# Patient Record
Sex: Female | Born: 1965 | Race: Black or African American | Hispanic: No | Marital: Married | Smoking: Never smoker
Health system: Southern US, Community
[De-identification: ages and names within clinical notes are randomized; demographics above are authoritative.]

## PROBLEM LIST (undated history)

## (undated) DIAGNOSIS — D649 Anemia, unspecified: Secondary | ICD-10-CM

## (undated) DIAGNOSIS — D259 Leiomyoma of uterus, unspecified: Secondary | ICD-10-CM

## (undated) DIAGNOSIS — Z789 Other specified health status: Secondary | ICD-10-CM

## (undated) HISTORY — PX: TUBAL LIGATION: SHX77

## (undated) HISTORY — DX: Leiomyoma of uterus, unspecified: D25.9

## (undated) HISTORY — PX: ABDOMINAL HYSTERECTOMY: SHX81

---

## 2005-03-22 ENCOUNTER — Emergency Department: Payer: Self-pay | Admitting: Emergency Medicine

## 2005-04-16 ENCOUNTER — Ambulatory Visit: Payer: Self-pay | Admitting: Unknown Physician Specialty

## 2006-04-25 ENCOUNTER — Ambulatory Visit: Payer: Self-pay | Admitting: Unknown Physician Specialty

## 2006-11-21 ENCOUNTER — Emergency Department: Payer: Self-pay

## 2007-05-22 ENCOUNTER — Ambulatory Visit: Payer: Self-pay | Admitting: Unknown Physician Specialty

## 2008-06-03 ENCOUNTER — Ambulatory Visit: Payer: Self-pay | Admitting: Unknown Physician Specialty

## 2009-06-06 ENCOUNTER — Ambulatory Visit: Payer: Self-pay | Admitting: Unknown Physician Specialty

## 2009-09-10 HISTORY — PX: UTERINE FIBROID EMBOLIZATION: SHX825

## 2010-06-07 ENCOUNTER — Ambulatory Visit: Payer: Self-pay | Admitting: Unknown Physician Specialty

## 2011-03-11 ENCOUNTER — Emergency Department: Payer: Self-pay | Admitting: Emergency Medicine

## 2011-03-14 ENCOUNTER — Emergency Department: Payer: Self-pay | Admitting: Emergency Medicine

## 2011-06-11 ENCOUNTER — Ambulatory Visit: Payer: Self-pay | Admitting: Unknown Physician Specialty

## 2012-06-13 ENCOUNTER — Ambulatory Visit: Payer: Self-pay | Admitting: Obstetrics and Gynecology

## 2013-06-15 ENCOUNTER — Ambulatory Visit: Payer: Self-pay | Admitting: Obstetrics & Gynecology

## 2014-04-22 ENCOUNTER — Ambulatory Visit: Payer: Self-pay | Admitting: Unknown Physician Specialty

## 2014-04-27 LAB — PATHOLOGY REPORT

## 2014-06-18 ENCOUNTER — Ambulatory Visit: Payer: Self-pay | Admitting: Obstetrics and Gynecology

## 2014-12-24 ENCOUNTER — Other Ambulatory Visit: Payer: Self-pay | Admitting: Obstetrics and Gynecology

## 2014-12-24 DIAGNOSIS — Z1231 Encounter for screening mammogram for malignant neoplasm of breast: Secondary | ICD-10-CM

## 2015-06-17 ENCOUNTER — Ambulatory Visit
Admission: RE | Admit: 2015-06-17 | Discharge: 2015-06-17 | Disposition: A | Discharge status: Home / Self Care | Payer: Federal, State, Local not specified - PPO | Source: Ambulatory Visit | Attending: Obstetrics and Gynecology | Admitting: Obstetrics and Gynecology

## 2015-06-17 DIAGNOSIS — Z1231 Encounter for screening mammogram for malignant neoplasm of breast: Secondary | ICD-10-CM | POA: Insufficient documentation

## 2015-12-14 ENCOUNTER — Encounter: Payer: Self-pay | Admitting: Obstetrics and Gynecology

## 2015-12-14 ENCOUNTER — Ambulatory Visit (INDEPENDENT_AMBULATORY_CARE_PROVIDER_SITE_OTHER): Payer: Federal, State, Local not specified - PPO | Admitting: Obstetrics and Gynecology

## 2015-12-14 VITALS — BP 107/62 | HR 63 | Ht 64.0 in | Wt 155.8 lb

## 2015-12-14 DIAGNOSIS — Z1239 Encounter for other screening for malignant neoplasm of breast: Secondary | ICD-10-CM | POA: Diagnosis not present

## 2015-12-14 DIAGNOSIS — N940 Mittelschmerz: Secondary | ICD-10-CM

## 2015-12-14 DIAGNOSIS — Z01419 Encounter for gynecological examination (general) (routine) without abnormal findings: Secondary | ICD-10-CM

## 2015-12-14 DIAGNOSIS — E663 Overweight: Secondary | ICD-10-CM | POA: Diagnosis not present

## 2015-12-14 NOTE — Patient Instructions (Signed)
Health Maintenance, Female Adopting a healthy lifestyle and getting preventive care can go a long way to promote health and wellness. Talk with your health care provider about what schedule of regular examinations is right for you. This is a good chance for you to check in with your provider about disease prevention and staying healthy. In between checkups, there are plenty of things you can do on your own. Experts have done a lot of research about which lifestyle changes and preventive measures are most likely to keep you healthy. Ask your health care provider for more information. WEIGHT AND DIET  Eat a healthy diet  Be sure to include plenty of vegetables, fruits, low-fat dairy products, and lean protein.  Do not eat a lot of foods high in solid fats, added sugars, or salt.  Get regular exercise. This is one of the most important things you can do for your health.  Most adults should exercise for at least 150 minutes each week. The exercise should increase your heart rate and make you sweat (moderate-intensity exercise).  Most adults should also do strengthening exercises at least twice a week. This is in addition to the moderate-intensity exercise.  Maintain a healthy weight  Body mass index (BMI) is a measurement that can be used to identify possible weight problems. It estimates body fat based on height and weight. Your health care provider can help determine your BMI and help you achieve or maintain a healthy weight.  For females 45 years of age and older:   A BMI below 18.5 is considered underweight.  A BMI of 18.5 to 24.9 is normal.  A BMI of 25 to 29.9 is considered overweight.  A BMI of 30 and above is considered obese.  Watch levels of cholesterol and blood lipids  You should start having your blood tested for lipids and cholesterol at 52 years of age, then have this test every 5 years.  You may need to have your cholesterol levels checked more often if:  Your lipid  or cholesterol levels are high.  You are older than 52 years of age.  You are at high risk for heart disease.  CANCER SCREENING   Lung Cancer  Lung cancer screening is recommended for adults 30-67 years old who are at high risk for lung cancer because of a history of smoking.  A yearly low-dose CT scan of the lungs is recommended for people who:  Currently smoke.  Have quit within the past 15 years.  Have at least a 30-pack-year history of smoking. A pack year is smoking an average of one pack of cigarettes a day for 1 year.  Yearly screening should continue until it has been 15 years since you quit.  Yearly screening should stop if you develop a health problem that would prevent you from having lung cancer treatment.  Breast Cancer  Practice breast self-awareness. This means understanding how your breasts normally appear and feel.  It also means doing regular breast self-exams. Let your health care provider know about any changes, no matter how small.  If you are in your 20s or 30s, you should have a clinical breast exam (CBE) by a health care provider every 1-3 years as part of a regular health exam.  If you are 75 or older, have a CBE every year. Also consider having a breast X-ray (mammogram) every year.  If you have a family history of breast cancer, talk to your health care provider about genetic screening.  If you  are at high risk for breast cancer, talk to your health care provider about having an MRI and a mammogram every year.  Breast cancer gene (BRCA) assessment is recommended for women who have family members with BRCA-related cancers. BRCA-related cancers include:  Breast.  Ovarian.  Tubal.  Peritoneal cancers.  Results of the assessment will determine the need for genetic counseling and BRCA1 and BRCA2 testing. Cervical Cancer Your health care provider may recommend that you be screened regularly for cancer of the pelvic organs (ovaries, uterus, and  vagina). This screening involves a pelvic examination, including checking for microscopic changes to the surface of your cervix (Pap test). You may be encouraged to have this screening done every 3 years, beginning at age 21.  For women ages 30-65, health care providers may recommend pelvic exams and Pap testing every 3 years, or they may recommend the Pap and pelvic exam, combined with testing for human papilloma virus (HPV), every 5 years. Some types of HPV increase your risk of cervical cancer. Testing for HPV may also be done on women of any age with unclear Pap test results.  Other health care providers may not recommend any screening for nonpregnant women who are considered low risk for pelvic cancer and who do not have symptoms. Ask your health care provider if a screening pelvic exam is right for you.  If you have had past treatment for cervical cancer or a condition that could lead to cancer, you need Pap tests and screening for cancer for at least 20 years after your treatment. If Pap tests have been discontinued, your risk factors (such as having a new sexual partner) need to be reassessed to determine if screening should resume. Some women have medical problems that increase the chance of getting cervical cancer. In these cases, your health care provider may recommend more frequent screening and Pap tests. Colorectal Cancer  This type of cancer can be detected and often prevented.  Routine colorectal cancer screening usually begins at 52 years of age and continues through 52 years of age.  Your health care provider may recommend screening at an earlier age if you have risk factors for colon cancer.  Your health care provider may also recommend using home test kits to check for hidden blood in the stool.  A small camera at the end of a tube can be used to examine your colon directly (sigmoidoscopy or colonoscopy). This is done to check for the earliest forms of colorectal  cancer.  Routine screening usually begins at age 50.  Direct examination of the colon should be repeated every 5-10 years through 52 years of age. However, you may need to be screened more often if early forms of precancerous polyps or small growths are found. Skin Cancer  Check your skin from head to toe regularly.  Tell your health care provider about any new moles or changes in moles, especially if there is a change in a mole's shape or color.  Also tell your health care provider if you have a mole that is larger than the size of a pencil eraser.  Always use sunscreen. Apply sunscreen liberally and repeatedly throughout the day.  Protect yourself by wearing long sleeves, pants, a wide-brimmed hat, and sunglasses whenever you are outside. HEART DISEASE, DIABETES, AND HIGH BLOOD PRESSURE   High blood pressure causes heart disease and increases the risk of stroke. High blood pressure is more likely to develop in:  People who have blood pressure in the high end   of the normal range (130-139/85-89 mm Hg).  People who are overweight or obese.  People who are African American.  If you are 38-23 years of age, have your blood pressure checked every 3-5 years. If you are 61 years of age or older, have your blood pressure checked every year. You should have your blood pressure measured twice--once when you are at a hospital or clinic, and once when you are not at a hospital or clinic. Record the average of the two measurements. To check your blood pressure when you are not at a hospital or clinic, you can use:  An automated blood pressure machine at a pharmacy.  A home blood pressure monitor.  If you are between 45 years and 39 years old, ask your health care provider if you should take aspirin to prevent strokes.  Have regular diabetes screenings. This involves taking a blood sample to check your fasting blood sugar level.  If you are at a normal weight and have a low risk for diabetes,  have this test once every three years after 52 years of age.  If you are overweight and have a high risk for diabetes, consider being tested at a younger age or more often. PREVENTING INFECTION  Hepatitis B  If you have a higher risk for hepatitis B, you should be screened for this virus. You are considered at high risk for hepatitis B if:  You were born in a country where hepatitis B is common. Ask your health care provider which countries are considered high risk.  Your parents were born in a high-risk country, and you have not been immunized against hepatitis B (hepatitis B vaccine).  You have HIV or AIDS.  You use needles to inject street drugs.  You live with someone who has hepatitis B.  You have had sex with someone who has hepatitis B.  You get hemodialysis treatment.  You take certain medicines for conditions, including cancer, organ transplantation, and autoimmune conditions. Hepatitis C  Blood testing is recommended for:  Everyone born from [birth year] through 1967.  Anyone with known risk factors for hepatitis C. Sexually transmitted infections (STIs)  You should be screened for sexually transmitted infections (STIs) including gonorrhea and chlamydia if:  You are sexually active and are younger than 52 years of age.  You are older than 53 years of age and your health care provider tells you that you are at risk for this type of infection.  Your sexual activity has changed since you were last screened and you are at an increased risk for chlamydia or gonorrhea. Ask your health care provider if you are at risk.  If you do not have HIV, but are at risk, it may be recommended that you take a prescription medicine daily to prevent HIV infection. This is called pre-exposure prophylaxis (PrEP). You are considered at risk if:  You are sexually active and do not regularly use condoms or know the HIV status of your partner(s).  You take drugs by injection.  You are sexually  active with a partner who has HIV. Talk with your health care provider about whether you are at high risk of being infected with HIV. If you choose to begin PrEP, you should first be tested for HIV. You should then be tested every 3 months for as long as you are taking PrEP.  PREGNANCY   If you are premenopausal and you may become pregnant, ask your health care provider about preconception counseling.  If you may  become pregnant, take 400 to 800 micrograms (mcg) of folic acid every day.  If you want to prevent pregnancy, talk to your health care provider about birth control (contraception). OSTEOPOROSIS AND MENOPAUSE   Osteoporosis is a disease in which the bones lose minerals and strength with aging. This can result in serious bone fractures. Your risk for osteoporosis can be identified using a bone density scan.  If you are 18 years of age or older, or if you are at risk for osteoporosis and fractures, ask your health care provider if you should be screened.  Ask your health care provider whether you should take a calcium or vitamin D supplement to lower your risk for osteoporosis.  Menopause may have certain physical symptoms and risks.  Hormone replacement therapy may reduce some of these symptoms and risks. Talk to your health care provider about whether hormone replacement therapy is right for you.  HOME CARE INSTRUCTIONS   Schedule regular health, dental, and eye exams.  Stay current with your immunizations.   Do not use any tobacco products including cigarettes, chewing tobacco, or electronic cigarettes.  If you are pregnant, do not drink alcohol.  If you are breastfeeding, limit how much and how often you drink alcohol.  Limit alcohol intake to no more than 1 drink per day for nonpregnant women. One drink equals 12 ounces of beer, 5 ounces of wine, or 1 ounces of hard liquor.  Do not use street drugs.  Do not share needles.  Ask your health care provider for help if  you need support or information about quitting drugs.  Tell your health care provider if you often feel depressed.  Tell your health care provider if you have ever been abused or do not feel safe at home.   This information is not intended to replace advice given to you by your health care provider. Make sure you discuss any questions you have with your health care provider.   Document Released: 03/12/2011 Document Revised: 09/17/2014 Document Reviewed: 07/29/2013 Elsevier Interactive Patient Education [employer].

## 2015-12-14 NOTE — Progress Notes (Signed)
GYNECOLOGY ANNUAL PHYSICAL EXAM PROGRESS NOTE  Subjective:    Lindsay Ward is a 50 y.o. P89 female who presents for an annual exam. The patient has no complaints today. The patient is sexually active. The patient wears seatbelts: yes. The patient participates in regular exercise: no. Has the patient ever been transfused or tattooed?: no. The patient reports that there is not domestic violence in her life.    Gynecologic History Menarche age: 35  Patient's last menstrual period was 12/07/2015. Contraception: tubal ligation History of STI's: Denies Last Pap: 12/2014. Results were: normal.  Denies h/o abnormal pap smears. Last mammogram: 06/2015. Results were: normal Last colonoscopy: 04/2015.  Results were: normal   Obstetric History   G2   P1   T1   P0   A1   TAB1   SAB0   E0   M0   L1     # Outcome Date GA Lbr Len/2nd Weight Sex Delivery Anes PTL Lv  2 TAB           1 Term      CS-LTranv  N Y       Past Medical History  Diagnosis Date  . Fibroid uterus     Past Surgical History  Procedure Laterality Date  . Tubal ligation    . Cesarean section    . Prolapsed uterine fibroid ligation      Family History  Problem Relation Age of Onset  . Breast cancer Neg Hx   . Hypertension Mother   . Diabetes Mother   . Diabetes Father     Social History   Social History  . Marital Status: Married    Spouse Name: N/A  . Number of Children: N/A  . Years of Education: N/A   Occupational History  . Not on file.   Social History Main Topics  . Smoking status: Never Smoker   . Smokeless tobacco: Not on file  . Alcohol Use: No  . Drug Use: No  . Sexual Activity: Yes    Birth Control/ Protection: Surgical   Other Topics Concern  . Not on file   Social History Narrative    No current outpatient prescriptions on file prior to visit.   No current facility-administered medications on file prior to visit.    No Known Allergies   Review of  Systems Constitutional: negative for chills, fatigue, fevers and sweats Eyes: negative for irritation, redness and visual disturbance Ears, nose, mouth, throat, and face: negative for hearing loss, nasal congestion, snoring and tinnitus Respiratory: negative for asthma, cough, sputum Cardiovascular: negative for chest pain, dyspnea, exertional chest pressure/discomfort, irregular heart beat, palpitations and syncope Gastrointestinal: negative for abdominal pain, change in bowel habits, nausea and vomiting Genitourinary: positive for occasional pelvic discomfort, alternating sides, around time of ovulation; also notes cycles stretching (coming q 4-5 weeks). Negative for abnormal menstrual periods, genital lesions, sexual problems and vaginal discharge, dysuria and urinary incontinence Integument/breast: negative for breast lump, breast tenderness and nipple discharge Hematologic/lymphatic: negative for bleeding and easy bruising Musculoskeletal:negative for back pain and muscle weakness Neurological: negative for dizziness, headaches, vertigo and weakness Endocrine: negative for diabetic symptoms including polydipsia, polyuria and skin dryness Allergic/Immunologic: negative for hay fever and urticaria       Objective:  Blood pressure 107/62, pulse 63, height 5\' 4"  (1.626 m), weight 155 lb 12.8 oz (70.67 kg), last menstrual period 12/07/2015. Body mass index is 26.73 kg/(m^2).  General Appearance:    Alert, cooperative, no distress,  appears stated age, overweight  Head:    Normocephalic, without obvious abnormality, atraumatic  Eyes:    PERRL, conjunctiva/corneas clear, EOM's intact, both eyes  Ears:    Normal external ear canals, both ears  Nose:   Nares normal, septum midline, mucosa normal, no drainage or sinus tenderness  Throat:   Lips, mucosa, and tongue normal; teeth and gums normal  Neck:   Supple, symmetrical, trachea midline, no adenopathy; thyroid: no  enlargement/tenderness/nodules; no carotid bruit or JVD  Back:     Symmetric, no curvature, ROM normal, no CVA tenderness  Lungs:     Clear to auscultation bilaterally, respirations unlabored  Chest Wall:    No tenderness or deformity   Heart:    Regular rate and rhythm, S1 and S2 normal, no murmur, rub or gallop  Breast Exam:    No tenderness, masses, or nipple abnormality  Abdomen:     Soft, non-tender, bowel sounds active all four quadrants, no masses, no organomegaly.    Genitalia:    Pelvic:external genitalia normal, vagina without lesions, discharge, or tenderness, rectovaginal septum  normal. Cervix normal in appearance, no cervical motion tenderness, no adnexal masses or tenderness.  Uterus enlarged (~ 14 week size) slightly deviated to the right, normal shape, mobile, regular contours, nontender.  Rectal:    Normal external sphincter.  No hemorrhoids appreciated. Internal exam not done.   Extremities:   Extremities normal, atraumatic, no cyanosis or edema  Pulses:   2+ and symmetric all extremities  Skin:   Skin color, texture, turgor normal, no rashes or lesions  Lymph nodes:   Cervical, supraclavicular, and axillary nodes normal  Neurologic:   CNII-XII intact, normal strength, sensation and reflexes throughout     Assessment:   Healthy female exam.   Mittelschmerz pain Overweight    Plan:    Patient has annual labs done by PCP.  Pap smear up to date.  Will be due for mammogram in October, ordered.  Breast self exam technique reviewed and patient encouraged to perform self-exam monthly. Discussed healthy lifestyle modifications. Can take OTC pain relievers as needed for ovulatory pain.   Follow up in 1 year for annual exam, or as needed.    Rubie Maid, MD [facility]

## 2015-12-16 ENCOUNTER — Encounter: Payer: Self-pay | Admitting: Obstetrics and Gynecology

## 2015-12-20 ENCOUNTER — Encounter: Payer: Self-pay | Admitting: Obstetrics and Gynecology

## 2016-06-18 ENCOUNTER — Ambulatory Visit
Admission: RE | Admit: 2016-06-18 | Discharge: 2016-06-18 | Disposition: A | Discharge status: Home / Self Care | Payer: Federal, State, Local not specified - PPO | Source: Ambulatory Visit | Attending: Obstetrics and Gynecology | Admitting: Obstetrics and Gynecology

## 2016-06-18 DIAGNOSIS — Z1231 Encounter for screening mammogram for malignant neoplasm of breast: Secondary | ICD-10-CM | POA: Insufficient documentation

## 2016-06-18 DIAGNOSIS — Z1239 Encounter for other screening for malignant neoplasm of breast: Secondary | ICD-10-CM

## 2016-09-12 ENCOUNTER — Encounter: Payer: Self-pay | Admitting: Obstetrics and Gynecology

## 2016-09-12 ENCOUNTER — Ambulatory Visit (INDEPENDENT_AMBULATORY_CARE_PROVIDER_SITE_OTHER): Payer: Federal, State, Local not specified - PPO | Admitting: Obstetrics and Gynecology

## 2016-09-12 VITALS — BP 130/85 | HR 80 | Ht 64.0 in | Wt 165.5 lb

## 2016-09-12 DIAGNOSIS — N924 Excessive bleeding in the premenopausal period: Secondary | ICD-10-CM | POA: Diagnosis not present

## 2016-09-12 DIAGNOSIS — D259 Leiomyoma of uterus, unspecified: Secondary | ICD-10-CM | POA: Diagnosis not present

## 2016-09-12 NOTE — Patient Instructions (Addendum)
Dysfunctional Uterine Bleeding Introduction Dysfunctional uterine bleeding is abnormal bleeding from the uterus. Dysfunctional uterine bleeding includes:  A period that comes earlier or later than usual.  A period that is lighter, heavier, or has blood clots.  Bleeding between periods.  Skipping one or more periods.  Bleeding after sexual intercourse.  Bleeding after menopause. Follow these instructions at home: Pay attention to any changes in your symptoms. Follow these instructions to help with your condition: Eating and drinking  Eat well-balanced meals. Include foods that are high in iron, such as liver, meat, shellfish, green leafy vegetables, and eggs.  If you become constipated:  Drink plenty of water.  Eat fruits and vegetables that are high in water and fiber, such as spinach, carrots, raspberries, apples, and mango. Medicines  Take over-the-counter and prescription medicines only as told by your health care provider.  Do not change medicines without talking with your health care provider.  Aspirin or medicines that contain aspirin may make the bleeding worse. Do not take those medicines:  During the week before your period.  During your period.  If you were prescribed iron pills, take them as told by your health care provider. Iron pills help to replace iron that your body loses because of this condition. Activity  If you need to change your sanitary pad or tampon more than one time every 2 hours:  Lie in bed with your feet raised (elevated).  Place a cold pack on your lower abdomen.  Rest as much as possible until the bleeding stops or slows down.  Do not try to lose weight until the bleeding has stopped and your blood iron level is back to normal. Other Instructions  For two months, write down:  When your period starts.  When your period ends.  When any abnormal bleeding occurs.  What problems you notice.  Keep all follow up visits as told by  your health care provider. This is important. Contact a health care provider if:  You get light-headed or weak.  You have nausea and vomiting.  You cannot eat or drink without vomiting.  You feel dizzy or have diarrhea while you are taking medicines.  You are taking birth control pills or hormones, and you want to change them or stop taking them. Get help right away if:  You develop a fever or chills.  You need to change your sanitary pad or tampon more than one time per hour.  Your bleeding becomes heavier, or your flow contains clots more often.  You develop pain in your abdomen.  You lose consciousness.  You develop a rash. This information is not intended to replace advice given to you by your health care provider. Make sure you discuss any questions you have with your health care provider. Document Released: 08/24/2000 Document Revised: 02/02/2016 Document Reviewed: 11/22/2014  2017 Elsevier  Uterine Fibroids Uterine fibroids are tissue masses (tumors) that can develop in the womb (uterus). They are also called leiomyomas. This type of tumor is not cancerous (benign) and does not spread to other parts of the body outside of the pelvic area, which is between the hip bones. Occasionally, fibroids may develop in the fallopian tubes, in the cervix, or on the support structures (ligaments) that surround the uterus. You can have one or many fibroids. Fibroids can vary in size, weight, and where they grow in the uterus. Some can become quite large. Most fibroids do not require medical treatment. What are the causes? A fibroid can develop when  a single uterine cell keeps growing (replicating). Most cells in the human body have a control mechanism that keeps them from replicating without control. What are the signs or symptoms? Symptoms may include:  Heavy bleeding during your period.  Bleeding or spotting between periods.  Pelvic pain and pressure.  Bladder problems, such as  needing to urinate more often (urinary frequency) or urgently.  Inability to reproduce offspring (infertility).  Miscarriages. How is this diagnosed? Uterine fibroids are diagnosed through a physical exam. Your health care provider may feel the lumpy tumors during a pelvic exam. Ultrasonography and an MRI may be done to determine the size, location, and number of fibroids. How is this treated? Treatment may include:  Watchful waiting. This involves getting the fibroid checked by your health care provider to see if it grows or shrinks. Follow your health care provider's recommendations for how often to have this checked.  Hormone medicines. These can be taken by mouth or given through an intrauterine device (IUD).  Surgery.  Removing the fibroids (myomectomy) or the uterus (hysterectomy).  Removing blood supply to the fibroids (uterine artery embolization). If fibroids interfere with your fertility and you want to become pregnant, your health care provider may recommend having the fibroids removed. Follow these instructions at home:  Keep all follow-up visits as directed by your health care provider. This is important.  Take over-the-counter and prescription medicines only as told by your health care provider.  If you were prescribed a hormone treatment, take the hormone medicines exactly as directed.  Ask your health care provider about taking iron pills and increasing the amount of dark green, leafy vegetables in your diet. These actions can help to boost your blood iron levels, which may be affected by heavy menstrual bleeding.  Pay close attention to your period and tell your health care provider about any changes, such as:  Increased blood flow that requires you to use more pads or tampons than usual per month.  A change in the number of days that your period lasts per month.  A change in symptoms that are associated with your period, such as abdominal cramping or back  pain. Contact a health care provider if:  You have pelvic pain, back pain, or abdominal cramps that cannot be controlled with medicines.  You have an increase in bleeding between and during periods.  You soak tampons or pads in a half hour or less.  You feel lightheaded, extra tired, or weak. Get help right away if:  You faint.  You have a sudden increase in pelvic pain. This information is not intended to replace advice given to you by your health care provider. Make sure you discuss any questions you have with your health care provider. Document Released: 08/24/2000 Document Revised: 04/26/2016 Document Reviewed: 02/23/2014 Elsevier Interactive Patient Education  2017 [employer].

## 2016-09-12 NOTE — Progress Notes (Signed)
    GYNECOLOGY PROGRESS NOTE  Subjective:    Patient ID: Lindsay Ward, female    DOB: September 28, 1965, 51 y.o.   MRN: [id number]  HPI  Patient is a 51 y.o. G13P1011 female with h/o fibroid uterus who presents for irregular menstrual cycles. Notes periods occurring almost every 10-14 days, lasting for 4-5 days, however notes that in September she bled for an entire month. Does report that she missed a menses in September.   The following portions of the patient's history were reviewed and updated as appropriate: allergies, current medications, past family history, past medical history, past social history, past surgical history and problem list.  Review of Systems Pertinent items noted in HPI and remainder of comprehensive ROS otherwise negative.   Objective:   Blood pressure 130/85, pulse 80, height 5\' 4"  (1.626 m), weight 165 lb 8 oz (75.1 kg), last menstrual period 09/08/2016. General appearance: alert and no distress Abdomen: normal findings: bowel sounds normal and soft, non-tender and abnormal findings:  palpable pelvic mass up to umbilicus Pelvic: external genitalia normal, rectovaginal septum normal.  Vagina without discharge.  Cervix normal appearing, no lesions and no motion tenderness, deviated anteriorly and to the left.  Uterus mobile, nontender,enlarged, 18-20 week size, regular contour.  Adnexae non-palpable, nontender bilaterally.  Extremities: extremities normal, atraumatic, no cyanosis or edema Neurologic: Grossly normal    Assessment:   Enlarged fibroid uterus Abnormal uterine bleeding  Plan:   - Will order ultrasound to assess fibroid uterus.  Last ultrasound was performed at [facility] ~ 2 years ago, noted at that time several fibroids, largest was 7 cm (scanned records reviewed in [facility]). - Endometrial biopsy performed today (see procedure note below).  - Will order CBC and TSH.  Patient to have performed at [facility] as clinic lab tech not in office today.  - Briefly  discussed management options for fibroid uterus and abnormal uterine bleeding including tranexamic acid (Lysteda), oral progesterone (Megace), Depo Provera, Mirena IUD, endometrial ablation (Novasure/Hydrothermal Ablation) or hysterectomy as definitive surgical management.  Discussed risks and benefits of each method.  Patient currently unsure, is hesitant about hysterectomy as she feels that she is very close to menopause and hopes that the abnormal bleeding will eventually subside and that fibroids may shrink over time.  Discussed that even if this occurred, the fibroids would not reduce by much in size, and that even after menopause she may have pressure symptoms.  Patient notes understanding.  Will continue discussion at next visit.  - Will notify patient of results of ultrasound and endometrial biopsy by phone.  Patient to have f/u appointment in 4 weeks.    Endometrial Biopsy Procedure Note  The patient is positioned on the exam table in the dorsal lithotomy position. Bimanual exam confirms uterine position and size. A Graves speculum is placed into the vagina. A single toothed tenaculum is placed onto the anterior lip of the cervix. The pipette is placed into the endocervical canal and is advanced to the uterine fundus. Using a piston like technique, with vacuum created by withdrawing the stylus, the endometrial specimen is obtained and transferred to the biopsy container. Minimal bleeding is encountered. The procedure is well tolerated.   Uterine Position: mid    Uterine Length: 17 cm   Uterine Specimen: Lush  Post procedure instructions are given. The patient will schedule a follow up appointment.    Rubie Maid, MD [facility]

## 2016-09-19 LAB — PATHOLOGY

## 2016-09-21 ENCOUNTER — Telehealth: Payer: Self-pay

## 2016-09-21 NOTE — Telephone Encounter (Signed)
Called pt no answer. LM for pt informing her of information below.  

## 2016-09-21 NOTE — Telephone Encounter (Signed)
-----   Message from Rubie Maid, MD sent at 09/21/2016 11:17 AM EST ----- Please inform of benign endometrial biopsy.  We will discuss her ultrasound results after it is performed at her next scheduled visit.

## 2016-09-28 ENCOUNTER — Ambulatory Visit (INDEPENDENT_AMBULATORY_CARE_PROVIDER_SITE_OTHER): Payer: Federal, State, Local not specified - PPO

## 2016-09-28 DIAGNOSIS — D259 Leiomyoma of uterus, unspecified: Secondary | ICD-10-CM

## 2016-10-10 ENCOUNTER — Encounter: Payer: Self-pay | Admitting: Obstetrics and Gynecology

## 2016-10-10 ENCOUNTER — Ambulatory Visit (INDEPENDENT_AMBULATORY_CARE_PROVIDER_SITE_OTHER): Payer: Federal, State, Local not specified - PPO | Admitting: Obstetrics and Gynecology

## 2016-10-10 VITALS — BP 120/73 | HR 75 | Ht 64.0 in | Wt 164.1 lb

## 2016-10-10 DIAGNOSIS — N939 Abnormal uterine and vaginal bleeding, unspecified: Secondary | ICD-10-CM

## 2016-10-10 DIAGNOSIS — N949 Unspecified condition associated with female genital organs and menstrual cycle: Secondary | ICD-10-CM

## 2016-10-10 DIAGNOSIS — D259 Leiomyoma of uterus, unspecified: Secondary | ICD-10-CM | POA: Diagnosis not present

## 2016-10-10 DIAGNOSIS — N9489 Other specified conditions associated with female genital organs and menstrual cycle: Secondary | ICD-10-CM

## 2016-10-10 NOTE — Progress Notes (Signed)
    GYNECOLOGY PROGRESS NOTE  Subjective:    Patient ID: Lindsay Ward, female    DOB: [birth year], 51 y.o.   MRN: [id number]  HPI  Patient is a 51 y.o. G49P1011 female who presents for f/u of ultrasound results.  Patient with abnormal perimenopausal bleeding and enlarged uterus, with h/o fibroids.  Patient notes that she has had 2 periods this month, which was heavier than normal.   The following portions of the patient's history were reviewed and updated as appropriate: allergies, current medications, past family history, past medical history, past social history, past surgical history and problem list.  Review of Systems Pertinent items noted in HPI and remainder of comprehensive ROS otherwise negative.   Objective:   Blood pressure 120/73, pulse 75, height 5\' 4"  (1.626 m), weight 164 lb 1.6 oz (74.4 kg), last menstrual period 09/24/2016. General appearance: alert and no distress Exam deferred.    Imaging:  ULTRASOUND REPORT  Location: [facility] Date of Service: 09/28/16  Indications: Enlarged uterus, history of fibroids Findings:  The uterus is enlarged and measures 14.6 x 8.9 x 10.8. Echo texture is diffusely heterogenous with evidence of focal masses. Within the uterus are multiple suspected fibroids measuring: Fibroid 1: 5.2 x 4.5 x 4.2 cm. Fibroid 2: 3.1 x 4.1 x 5.6 cm. Fibroid 3: 4.7 x 5 x 5.7 cm.   The Endometrium measures 3.2 mm.  Right and left ovaries were not visualized due to the enlarged uterus. Survey of the right adnexa demonstrates no adnexal mass. In the left adnexa is a . There is no free fluid in the cul de sac.  Impression: 1. Enlarged uterus with multiple fibroids, largest is 4.7x 5 x 5.7.          2. There is a mass in the left adnexa measuring 8.5 x 5.6 x 5.5 . Cannot determine as to whether this is an ovarian mass or a pedunculated fibroid.   Recommendations: 1.Clinical correlation with the patient's History and Physical  Exam.  Assessment:   Abnormal uterine bleeding (perimenoopausal) Fibroid uterus Left adnexal mass  Plan:   - Discussed ultrasound results with patient.  Enlarged uterus with several large fibroids present.   - Discussed management options, including hormonal therapy with progesterone (Depo Provera, Megace), or surgical management with hysterectomy (recommended).   Patient notes that she desires to think about it, but will likely consider hysterectomy.  Given handouts to review. To notify MD once decision is made.  - Large left adnexal mass, unsure if pedunculated fibroid vs ovarian mass.  Will order CA-125 (future order as patient notes she is unable to stay for labs today).    A total of 15 minutes were spent face-to-face with the patient during this encounter and over half of that time dealt with counseling and coordination of care.    Rubie Maid, MD [facility]

## 2016-11-15 ENCOUNTER — Encounter: Payer: Federal, State, Local not specified - PPO | Admitting: Obstetrics and Gynecology

## 2016-11-15 ENCOUNTER — Ambulatory Visit (INDEPENDENT_AMBULATORY_CARE_PROVIDER_SITE_OTHER): Payer: Federal, State, Local not specified - PPO | Admitting: Obstetrics and Gynecology

## 2016-11-15 VITALS — BP 110/71 | HR 68 | Ht 64.0 in | Wt 162.3 lb

## 2016-11-15 DIAGNOSIS — N924 Excessive bleeding in the premenopausal period: Secondary | ICD-10-CM | POA: Diagnosis not present

## 2016-11-15 DIAGNOSIS — D259 Leiomyoma of uterus, unspecified: Secondary | ICD-10-CM

## 2016-11-15 NOTE — Progress Notes (Signed)
    GYNECOLOGY PROGRESS NOTE  Subjective:    Patient ID: Lindsay Ward, female    DOB: 14-Jan-1966, 51 y.o.   MRN: [id number]  HPI  Patient is a 51 y.o. G24P1011 female who presents for discussion of hysterectomy.  Patient has a h/o enlarged fibroid uterus and abnormal perimenopausal bleeding.  She has undergone a negative workup thus far. Notes that she has thought it over and would now like to proceed with surgical management.   The following portions of the patient's history were reviewed and updated as appropriate: allergies, current medications, past family history, past medical history, past social history, past surgical history and problem list.  Review of Systems Pertinent items noted in HPI and remainder of comprehensive ROS otherwise negative.   Objective:   Blood pressure 110/71, pulse 68, height 5\' 4"  (1.626 m), weight 162 lb 4.8 oz (73.6 kg), last menstrual period 10/27/2016. General appearance: cooperative and no distress Abdomen: normal findings: no organomegaly and soft, non-tender and abnormal findings:  mass, located in the lower abdomen (~ 14-16 week sized from pelvis, irregular contours, mobile.  Pelvic: deferred   Assessment:   Enlarged uterus with fibroids Abnormal perimenopausal bleeding  Plan:   Patient desires definitive management with hysterectomy.  I proposed doing a total abdominal hysterectomy (TAH) and prophylactic bilateral salpingectomy.  No  indication for oophorectomy.  Patient agrees with this proposed surgery.  The risks of surgery were discussed in detail with the patient including but not limited to: bleeding which may require transfusion or reoperation; infection which may require antibiotics; injury to bowel, bladder, ureters or other surrounding organs; need for additional procedures including laparotomy; thromboembolic phenomenon, incisional problems and other postoperative/anesthesia complications.  Patient was also advised that she will remain in  house for 2-3 nights; and expected recovery time after a hysterectomy is 6-8 weeks.  Likelihood of success in alleviating the patient's symptoms was discussed.   She was told that she will be contacted by our surgical scheduler regarding the time and date of her surgery.  Patient desires surgery date of February 11, 2017.   Will have patient scheduled towards the end of May for a pre-operative appointment.  Patient has handout to review.     A total of 20 minutes were spent face-to-face with the patient during this encounter and over half of that time involved counseling and coordination of care.

## 2016-12-13 ENCOUNTER — Encounter: Payer: Federal, State, Local not specified - PPO | Admitting: Obstetrics and Gynecology

## 2017-01-02 ENCOUNTER — Ambulatory Visit (INDEPENDENT_AMBULATORY_CARE_PROVIDER_SITE_OTHER): Payer: Federal, State, Local not specified - PPO | Admitting: Obstetrics and Gynecology

## 2017-01-02 ENCOUNTER — Encounter: Payer: Self-pay | Admitting: Obstetrics and Gynecology

## 2017-01-02 VITALS — BP 111/74 | HR 71 | Ht 64.0 in | Wt 161.8 lb

## 2017-01-02 DIAGNOSIS — E663 Overweight: Secondary | ICD-10-CM

## 2017-01-02 DIAGNOSIS — Z01419 Encounter for gynecological examination (general) (routine) without abnormal findings: Secondary | ICD-10-CM

## 2017-01-02 DIAGNOSIS — D259 Leiomyoma of uterus, unspecified: Secondary | ICD-10-CM

## 2017-01-02 DIAGNOSIS — N924 Excessive bleeding in the premenopausal period: Secondary | ICD-10-CM

## 2017-01-02 NOTE — Patient Instructions (Addendum)
Health Maintenance, Female Adopting a healthy lifestyle and getting preventive care can go a long way to promote health and wellness. Talk with your health care provider about what schedule of regular examinations is right for you. This is a good chance for you to check in with your provider about disease prevention and staying healthy. In between checkups, there are plenty of things you can do on your own. Experts have done a lot of research about which lifestyle changes and preventive measures are most likely to keep you healthy. Ask your health care provider for more information. Weight and diet Eat a healthy diet  Be sure to include plenty of vegetables, fruits, low-fat dairy products, and lean protein.  Do not eat a lot of foods high in solid fats, added sugars, or salt.  Get regular exercise. This is one of the most important things you can do for your health.  Most adults should exercise for at least 150 minutes each week. The exercise should increase your heart rate and make you sweat (moderate-intensity exercise).  Most adults should also do strengthening exercises at least twice a week. This is in addition to the moderate-intensity exercise. Maintain a healthy weight  Body mass index (BMI) is a measurement that can be used to identify possible weight problems. It estimates body fat based on height and weight. Your health care provider can help determine your BMI and help you achieve or maintain a healthy weight.  For females 75 years of age and older:  A BMI below 18.5 is considered underweight.  A BMI of 18.5 to 24.9 is normal.  A BMI of 25 to 29.9 is considered overweight.  A BMI of 30 and above is considered obese. Watch levels of cholesterol and blood lipids  You should start having your blood tested for lipids and cholesterol at 53 years of age, then have this test every 5 years.  You may need to have your cholesterol levels checked more often if:  Your lipid or  cholesterol levels are high.  You are older than 53 years of age.  You are at high risk for heart disease. Cancer screening Lung Cancer  Lung cancer screening is recommended for adults 52-45 years old who are at high risk for lung cancer because of a history of smoking.  A yearly low-dose CT scan of the lungs is recommended for people who:  Currently smoke.  Have quit within the past 15 years.  Have at least a 30-pack-year history of smoking. A pack year is smoking an average of one pack of cigarettes a day for 1 year.  Yearly screening should continue until it has been 15 years since you quit.  Yearly screening should stop if you develop a health problem that would prevent you from having lung cancer treatment. Breast Cancer  Practice breast self-awareness. This means understanding how your breasts normally appear and feel.  It also means doing regular breast self-exams. Let your health care provider know about any changes, no matter how small.  If you are in your 20s or 30s, you should have a clinical breast exam (CBE) by a health care provider every 1-3 years as part of a regular health exam.  If you are 25 or older, have a CBE every year. Also consider having a breast X-ray (mammogram) every year.  If you have a family history of breast cancer, talk to your health care provider about genetic screening.  If you are at high risk for breast cancer, talk  to your health care provider about having an MRI and a mammogram every year.  Breast cancer gene (BRCA) assessment is recommended for women who have family members with BRCA-related cancers. BRCA-related cancers include:  Breast.  Ovarian.  Tubal.  Peritoneal cancers.  Results of the assessment will determine the need for genetic counseling and BRCA1 and BRCA2 testing. Cervical Cancer  Your health care provider may recommend that you be screened regularly for cancer of the pelvic organs (ovaries, uterus, and vagina).  This screening involves a pelvic examination, including checking for microscopic changes to the surface of your cervix (Pap test). You may be encouraged to have this screening done every 3 years, beginning at age 24.  For women ages 66-65, health care providers may recommend pelvic exams and Pap testing every 3 years, or they may recommend the Pap and pelvic exam, combined with testing for human papilloma virus (HPV), every 5 years. Some types of HPV increase your risk of cervical cancer. Testing for HPV may also be done on women of any age with unclear Pap test results.  Other health care providers may not recommend any screening for nonpregnant women who are considered low risk for pelvic cancer and who do not have symptoms. Ask your health care provider if a screening pelvic exam is right for you.  If you have had past treatment for cervical cancer or a condition that could lead to cancer, you need Pap tests and screening for cancer for at least 20 years after your treatment. If Pap tests have been discontinued, your risk factors (such as having a new sexual partner) need to be reassessed to determine if screening should resume. Some women have medical problems that increase the chance of getting cervical cancer. In these cases, your health care provider may recommend more frequent screening and Pap tests. Colorectal Cancer  This type of cancer can be detected and often prevented.  Routine colorectal cancer screening usually begins at 53 years of age and continues through 53 years of age.  Your health care provider may recommend screening at an earlier age if you have risk factors for colon cancer.  Your health care provider may also recommend using home test kits to check for hidden blood in the stool.  A small camera at the end of a tube can be used to examine your colon directly (sigmoidoscopy or colonoscopy). This is done to check for the earliest forms of colorectal cancer.  Routine  screening usually begins at age 41.  Direct examination of the colon should be repeated every 5-10 years through 53 years of age. However, you may need to be screened more often if early forms of precancerous polyps or small growths are found. Skin Cancer  Check your skin from head to toe regularly.  Tell your health care provider about any new moles or changes in moles, especially if there is a change in a mole's shape or color.  Also tell your health care provider if you have a mole that is larger than the size of a pencil eraser.  Always use sunscreen. Apply sunscreen liberally and repeatedly throughout the day.  Protect yourself by wearing long sleeves, pants, a wide-brimmed hat, and sunglasses whenever you are outside. Heart disease, diabetes, and high blood pressure  High blood pressure causes heart disease and increases the risk of stroke. High blood pressure is more likely to develop in:  People who have blood pressure in the high end of the normal range (130-139/85-89 mm Hg).  People who are overweight or obese.  People who are African American.  If you are 59-24 years of age, have your blood pressure checked every 3-5 years. If you are 34 years of age or older, have your blood pressure checked every year. You should have your blood pressure measured twice-once when you are at a hospital or clinic, and once when you are not at a hospital or clinic. Record the average of the two measurements. To check your blood pressure when you are not at a hospital or clinic, you can use:  An automated blood pressure machine at a pharmacy.  A home blood pressure monitor.  If you are between 29 years and 60 years old, ask your health care provider if you should take aspirin to prevent strokes.  Have regular diabetes screenings. This involves taking a blood sample to check your fasting blood sugar level.  If you are at a normal weight and have a low risk for diabetes, have this test once  every three years after 53 years of age.  If you are overweight and have a high risk for diabetes, consider being tested at a younger age or more often. Preventing infection Hepatitis B  If you have a higher risk for hepatitis B, you should be screened for this virus. You are considered at high risk for hepatitis B if:  You were born in a country where hepatitis B is common. Ask your health care provider which countries are considered high risk.  Your parents were born in a high-risk country, and you have not been immunized against hepatitis B (hepatitis B vaccine).  You have HIV or AIDS.  You use needles to inject street drugs.  You live with someone who has hepatitis B.  You have had sex with someone who has hepatitis B.  You get hemodialysis treatment.  You take certain medicines for conditions, including cancer, organ transplantation, and autoimmune conditions. Hepatitis C  Blood testing is recommended for:  Everyone born from [birth year] through 1967.  Anyone with known risk factors for hepatitis C. Sexually transmitted infections (STIs)  You should be screened for sexually transmitted infections (STIs) including gonorrhea and chlamydia if:  You are sexually active and are younger than 53 years of age.  You are older than 53 years of age and your health care provider tells you that you are at risk for this type of infection.  Your sexual activity has changed since you were last screened and you are at an increased risk for chlamydia or gonorrhea. Ask your health care provider if you are at risk.  If you do not have HIV, but are at risk, it may be recommended that you take a prescription medicine daily to prevent HIV infection. This is called pre-exposure prophylaxis (PrEP). You are considered at risk if:  You are sexually active and do not regularly use condoms or know the HIV status of your partner(s).  You take drugs by injection.  You are sexually active with a partner  who has HIV. Talk with your health care provider about whether you are at high risk of being infected with HIV. If you choose to begin PrEP, you should first be tested for HIV. You should then be tested every 3 months for as long as you are taking PrEP. Pregnancy  If you are premenopausal and you may become pregnant, ask your health care provider about preconception counseling.  If you may become pregnant, take 400 to 800 micrograms (mcg) of folic acid  every day.  If you want to prevent pregnancy, talk to your health care provider about birth control (contraception). Osteoporosis and menopause  Osteoporosis is a disease in which the bones lose minerals and strength with aging. This can result in serious bone fractures. Your risk for osteoporosis can be identified using a bone density scan.  If you are 4 years of age or older, or if you are at risk for osteoporosis and fractures, ask your health care provider if you should be screened.  Ask your health care provider whether you should take a calcium or vitamin D supplement to lower your risk for osteoporosis.  Menopause may have certain physical symptoms and risks.  Hormone replacement therapy may reduce some of these symptoms and risks. Talk to your health care provider about whether hormone replacement therapy is right for you. Follow these instructions at home:  Schedule regular health, dental, and eye exams.  Stay current with your immunizations.  Do not use any tobacco products including cigarettes, chewing tobacco, or electronic cigarettes.  If you are pregnant, do not drink alcohol.  If you are breastfeeding, limit how much and how often you drink alcohol.  Limit alcohol intake to no more than 1 drink per day for nonpregnant women. One drink equals 12 ounces of beer, 5 ounces of wine, or 1 ounces of hard liquor.  Do not use street drugs.  Do not share needles.  Ask your health care provider for help if you need support  or information about quitting drugs.  Tell your health care provider if you often feel depressed.  Tell your health care provider if you have ever been abused or do not feel safe at home. This information is not intended to replace advice given to you by your health care provider. Make sure you discuss any questions you have with your health care provider. Document Released: 03/12/2011 Document Revised: 02/02/2016 Document Reviewed: 05/31/2015 Elsevier Interactive Patient Education  2017 [employer].

## 2017-01-02 NOTE — Progress Notes (Signed)
GYNECOLOGY ANNUAL PHYSICAL EXAM PROGRESS NOTE  Subjective:    Lindsay Ward is a 51 y.o. G28P1011 female who presents for an annual exam. The patient has no major complaints today. The patient is sexually active. The patient wears seatbelts: yes. The patient participates in regular exercise: yes. Has the patient ever been transfused or tattooed?: no. The patient reports that there is not domestic violence in her life.    Gynecologic History Menarche age: 22 No LMP recorded. Contraception: tubal ligation History of STI's: Denies Last Pap: 09/2016. Results were: normal.  Denies h/o abnormal pap smears. Last mammogram: 06/2016. Results were: normal   Obstetric History   G2   P1   T1   P0   A1   L1    SAB0   TAB1   Ectopic0   Multiple0   Live Births1     # Outcome Date GA Lbr Len/2nd Weight Sex Delivery Anes PTL Lv  2 TAB           1 Term      CS-LTranv  N LIV      Past Medical History:  Diagnosis Date  . Fibroid uterus     Past Surgical History:  Procedure Laterality Date  . CESAREAN SECTION  2001  . TUBAL LIGATION    . UTERINE FIBROID EMBOLIZATION  2011    Family History  Problem Relation Age of Onset  . Hypertension Mother   . Diabetes Mother   . Diabetes Father   . Breast cancer Neg Hx     Social History   Social History  . Marital status: Married    Spouse name: N/A  . Number of children: N/A  . Years of education: N/A   Occupational History  . Not on file.   Social History Main Topics  . Smoking status: Never Smoker  . Smokeless tobacco: Never Used  . Alcohol use No  . Drug use: No  . Sexual activity: Yes    Birth control/ protection: Surgical   Other Topics Concern  . Not on file   Social History Narrative  . No narrative on file    Current Outpatient Prescriptions on File Prior to Visit  Medication Sig Dispense Refill  . Cholecalciferol (VITAMIN D3) 2000 units capsule Take by mouth.    . Multiple Vitamin (MULTI-VITAMINS) TABS Take by  mouth.    . omega-3 acid ethyl esters (LOVAZA) 1 g capsule Take by mouth 2 (two) times daily.     No current facility-administered medications on file prior to visit.     No Known Allergies   Review of Systems Constitutional: negative for chills, fatigue, fevers and sweats Eyes: negative for irritation, redness and visual disturbance Ears, nose, mouth, throat, and face: negative for hearing loss, nasal congestion, snoring and tinnitus Respiratory: negative for asthma, cough, sputum Cardiovascular: negative for chest pain, dyspnea, exertional chest pressure/discomfort, irregular heart beat, palpitations and syncope Gastrointestinal: negative for abdominal pain, change in bowel habits, nausea and vomiting Genitourinary: positive for abnormal menstrual periods (periods with onset between q 2 weeks to q 2 months), genital lesions, sexual problems and vaginal discharge, dysuria and urinary incontinence Integument/breast: negative for breast lump, breast tenderness and nipple discharge Hematologic/lymphatic: negative for bleeding and easy bruising Musculoskeletal:negative for back pain and muscle weakness Neurological: negative for dizziness, headaches, vertigo and weakness Endocrine: negative for diabetic symptoms including polydipsia, polyuria and skin dryness Allergic/Immunologic: negative for hay fever and urticaria       Objective:  Blood pressure 111/74, pulse 71, height 5\' 4"  (1.626 m), weight 161 lb 12.8 oz (73.4 kg). Body mass index is 27.77 kg/m.  General Appearance:    Alert, cooperative, no distress, appears stated age  Head:    Normocephalic, without obvious abnormality, atraumatic  Eyes:    PERRL, conjunctiva/corneas clear, EOM's intact, both eyes  Ears:    Normal external ear canals, both ears  Nose:   Nares normal, septum midline, mucosa normal, no drainage or sinus tenderness  Throat:   Lips, mucosa, and tongue normal; teeth and gums normal  Neck:   Supple, symmetrical,  trachea midline, no adenopathy; thyroid: no enlargement/tenderness/nodules; no carotid bruit or JVD  Back:     Symmetric, no curvature, ROM normal, no CVA tenderness  Lungs:     Clear to auscultation bilaterally, respirations unlabored  Chest Wall:    No tenderness or deformity   Heart:    Regular rate and rhythm, S1 and S2 normal, no murmur, rub or gallop  Breast Exam:    No tenderness, masses, or nipple abnormality  Abdomen:     Soft, non-tender, bowel sounds active all four quadrants, no organomegaly.  Palpable mass up to 2 cm above umbilicus.   Genitalia:    Pelvic:external genitalia normal, vagina without lesions, discharge, or tenderness, rectovaginal septum  normal. Cervix displaced anteriorly and to the left. Cervix normal in appearance, no cervical motion tenderness, no adnexal masses or tenderness.  Uterus enlarged, mobile, irregular contours, nontender.  Rectal:    Normal external sphincter.  No hemorrhoids appreciated. Internal exam not done.   Extremities:   Extremities normal, atraumatic, no cyanosis or edema  Pulses:   2+ and symmetric all extremities  Skin:   Skin color, texture, turgor normal, no rashes or lesions  Lymph nodes:   Cervical, supraclavicular, and axillary nodes normal  Neurologic:   CNII-XII intact, normal strength, sensation and reflexes throughout    Labs:  No results found for: WBC, HGB, HCT, MCV, PLT  No results found for: CREATININE, BUN, NA, K, CL, CO2  No results found for: ALT, AST, GGT, ALKPHOS, BILITOT  No results found for: TSH   Assessment:   Healthy female exam.  Enlarged fibroid uterus  Perimenopausal menorrhagia Overweight  Plan:     Blood tests: to be performed by PCP.  Last labs in July 2017, reviewed in [facility]. Breast self exam technique reviewed and patient encouraged to perform self-exam monthly. Contraception: tubal ligation. Discussed healthy lifestyle modifications. Mammogram up to date. Pap smear up to  date. Schedule for surgery - in May for total hysterectomy with bilateral salpingectomy due to enlarged fibroid uterus and perimenopausal menorrhagia.  Has pre-op appointment also scheduled.  Follow up in 1 year for annual exam.   Rubie Maid, MD [facility]

## 2017-01-03 ENCOUNTER — Encounter: Payer: Federal, State, Local not specified - PPO | Admitting: Obstetrics and Gynecology

## 2017-01-03 ENCOUNTER — Encounter: Payer: Self-pay | Admitting: Obstetrics and Gynecology

## 2017-01-03 DIAGNOSIS — D259 Leiomyoma of uterus, unspecified: Secondary | ICD-10-CM | POA: Insufficient documentation

## 2017-01-03 DIAGNOSIS — N924 Excessive bleeding in the premenopausal period: Secondary | ICD-10-CM | POA: Insufficient documentation

## 2017-01-03 DIAGNOSIS — E663 Overweight: Secondary | ICD-10-CM | POA: Insufficient documentation

## 2017-02-06 ENCOUNTER — Inpatient Hospital Stay: Admission: RE | Admit: 2017-02-06 | Payer: Federal, State, Local not specified - PPO | Source: Ambulatory Visit

## 2017-02-07 ENCOUNTER — Encounter: Payer: Federal, State, Local not specified - PPO | Admitting: Obstetrics and Gynecology

## 2017-02-08 ENCOUNTER — Ambulatory Visit (INDEPENDENT_AMBULATORY_CARE_PROVIDER_SITE_OTHER): Payer: Federal, State, Local not specified - PPO | Admitting: Obstetrics and Gynecology

## 2017-02-08 ENCOUNTER — Encounter: Payer: Self-pay | Admitting: Obstetrics and Gynecology

## 2017-02-08 ENCOUNTER — Encounter
Admission: RE | Admit: 2017-02-08 | Discharge: 2017-02-08 | Disposition: A | Discharge status: Home / Self Care | Payer: Federal, State, Local not specified - PPO | Source: Ambulatory Visit | Attending: Obstetrics and Gynecology | Admitting: Obstetrics and Gynecology

## 2017-02-08 VITALS — BP 110/75 | HR 63 | Ht 64.0 in | Wt 162.4 lb

## 2017-02-08 DIAGNOSIS — D259 Leiomyoma of uterus, unspecified: Secondary | ICD-10-CM | POA: Insufficient documentation

## 2017-02-08 DIAGNOSIS — N852 Hypertrophy of uterus: Secondary | ICD-10-CM

## 2017-02-08 DIAGNOSIS — Z01818 Encounter for other preprocedural examination: Secondary | ICD-10-CM | POA: Diagnosis present

## 2017-02-08 DIAGNOSIS — N924 Excessive bleeding in the premenopausal period: Secondary | ICD-10-CM | POA: Diagnosis not present

## 2017-02-08 HISTORY — DX: Anemia, unspecified: D64.9

## 2017-02-08 HISTORY — DX: Other specified health status: Z78.9

## 2017-02-08 LAB — BASIC METABOLIC PANEL
Anion gap: 5 (ref 5–15)
BUN: 8 mg/dL (ref 6–20)
CHLORIDE: 104 mmol/L (ref 101–111)
CO2: 28 mmol/L (ref 22–32)
CREATININE: 0.62 mg/dL (ref 0.44–1.00)
Calcium: 9 mg/dL (ref 8.9–10.3)
GFR calc Af Amer: >60 mL/min (ref >60)
Glucose, Bld: 85 mg/dL (ref 65–99)
Potassium: 4.3 mmol/L (ref 3.5–5.1)
SODIUM: 137 mmol/L (ref 135–145)

## 2017-02-08 LAB — RAPID HIV SCREEN (HIV 1/2 AB+AG)
HIV 1/2 Antibodies: NONREACTIVE
HIV-1 P24 Antigen - HIV24: NONREACTIVE

## 2017-02-08 LAB — CBC
HCT: 36.8 % (ref 35.0–47.0)
Hemoglobin: 11.7 g/dL — ABNORMAL LOW (ref 12.0–16.0)
MCH: 23 pg — ABNORMAL LOW (ref 26.0–34.0)
MCHC: 31.8 g/dL — ABNORMAL LOW (ref 32.0–36.0)
MCV: 72.3 fL — AB (ref 80.0–100.0)
Platelets: 252 10*3/uL (ref 150–440)
RBC: 5.09 MIL/uL (ref 3.80–5.20)
RDW: 14.8 % — AB (ref 11.5–14.5)
WBC: 4.5 10*3/uL (ref 3.6–11.0)

## 2017-02-08 NOTE — Patient Instructions (Signed)
Your procedure is scheduled on: 02/11/17 Report to Same Day Surgery 2nd floor medical mall [facility] Entrance-take elevator on left to 2nd floor.  Check in with surgery information desk.) To find out your arrival time please call [phone] between 1PM - 3PM on 02/08/17  Remember: Instructions that are not followed completely may result in serious medical risk, up to and including death, or upon the discretion of your surgeon and anesthesiologist your surgery may need to be rescheduled.    _x___ 1. Do not eat food or drink liquids after midnight. No gum chewing or                              hard candies.     __x__ 2. No Alcohol for 24 hours before or after surgery.   __x__3. No Smoking for 24 prior to surgery.   ____  4. Bring all medications with you on the day of surgery if instructed.    __x__ 5. Notify your doctor if there is any change in your medical condition     (cold, fever, infections).     Do not wear jewelry, make-up, hairpins, clips or nail polish.  Do not wear lotions, powders, or perfumes. You may wear deodorant.  Do not shave 48 hours prior to surgery. Men may shave face and neck.  Do not bring valuables to the hospital.    [facility] is not responsible for any belongings or valuables.               Contacts, dentures or bridgework may not be worn into surgery.  Leave your suitcase in the car. After surgery it may be brought to your room.  For patients admitted to the hospital, discharge time is determined by your                       treatment team.   Patients discharged the day of surgery will not be allowed to drive home.  You will need someone to drive you home and stay with you the night of your procedure.    Please read over the following fact sheets that you were given:   [facility] Preparing for Surgery  _x___ Take anti-hypertensive (unless it includes a diuretic), cardiac, seizure, asthma,     anti-reflux and psychiatric medicines. These  include:  1. none  2.  3.  4.  5.  6.  ____Fleets enema or Magnesium Citrate as directed.   _x___ Use CHG Soap or sage wipes as directed on instruction sheet   ____ Use inhalers on the day of surgery and bring to hospital day of surgery  ____ Stop Metformin and Janumet 2 days prior to surgery.    ____ Take 1/2 of usual insulin dose the night before surgery and none on the morning     surgery.      __ Follow recommendations from Cardiologist, Pulmonologist or PCP regarding          stopping Aspirin, Coumadin, Pllavix ,Eliquis, Effient, or Pradaxa, and Pletal.  X____Stop Anti-inflammatories such as Advil, Aleve, Ibuprofen, Motrin, Naproxen, Naprosyn, Goodies powders or aspirin products. OK to take Tylenol and   Celebrex.   _x___ Stop supplements until after surgery.  But may continue Vitamin D, Vitamin B,  and multivitamin.   Stop fish oil until after surgery   ____ Bring C-Pap to the hospital.   Practice spirometry and bring day of  surgery

## 2017-02-08 NOTE — Patient Instructions (Signed)
You are scheduled for surgery on 02/11/2017.  Nothing to eat after midnight on day prior to surgery.  Do not take any medications unless recommended by your provider on day prior to surgery.  Do not take NSAIDs (Motrin, Aleve) or aspirin 7 days prior to surgery.  You may take Tylenol products for minor aches and pains.  You will receive a prescription for pain medications post-operatively.  You will be contacted by phone approximately 1 week prior to surgery to schedule pre-operative appointment.  Please call the office if you have any questions regarding your upcoming surgery.      Abdominal Hysterectomy Abdominal hysterectomy is a surgical procedure to remove the womb (uterus). The uterus is the muscular organ that houses a developing baby. This surgery may be done if:  You have cancer.  You have growths (tumors or fibroids) in the uterus.  You have long-term (chronic) pain.  You are bleeding.  Your uterus has slipped down into your vagina (uterine prolapse).  You have a condition in which the tissue that lines the uterus grows outside of its normal location (endometriosis).  You have an infection in your uterus.  You are having problems with your menstrual cycle.  Depending on why you are having this procedure, you may also have other reproductive organs removed. These could include:  The part of your vagina that connects with your uterus (cervix).  The organs that make eggs (ovaries).  The tubes that connect the ovaries to the uterus (fallopian tubes).  Tell a health care provider about:  Any allergies you have.  All medicines you are taking, including vitamins, herbs, eye drops, creams, and over-the-counter medicines.  Any problems you or family members have had with anesthetic medicines.  Any blood disorders you have.  Any surgeries you have had.  Any medical conditions you have.  Whether you are pregnant or may be pregnant. What are the risks? Generally,  this is a safe procedure. However, problems may occur, including:  Bleeding.  Infection.  Allergic reactions to medicines or dyes.  Damage to other structures or organs.  Nerve injury.  Decreased interest in sex or pain during sex.  Blood clots that can break free and travel to your lungs.  What happens before the procedure? Staying hydrated Follow instructions from your health care provider about hydration, which may include:  Up to 2 hours before the procedure - you may continue to drink clear liquids, such as water, clear fruit juice, black coffee, and plain tea  Eating and drinking restrictions Follow instructions from your health care provider about eating and drinking, which may include:  8 hours before the procedure - stop eating heavy meals or foods such as meat, fried foods, or fatty foods.  6 hours before the procedure - stop eating light meals or foods, such as toast or cereal.  6 hours before the procedure - stop drinking milk or drinks that contain milk.  2 hours before the procedure - stop drinking clear liquids.  Medicines  Ask your health care provider about: ? Changing or stopping your regular medicines. This is especially important if you are taking diabetes medicines or blood thinners. ? Taking medicines such as aspirin and ibuprofen. These medicines can thin your blood. Do not take these medicines before your procedure if your health care provider instructs you not to.  You may be given antibiotic medicine to help prevent infection. Take it as told by your health care provider.  You may be asked to take  laxatives to prevent constipation. General instructions  Ask your health care provider how your surgical site will be marked or identified.  You may be asked to shower with a germ-killing soap.  Plan to have someone take you home from the hospital.  Do not use any products that contain nicotine or tobacco, such as cigarettes and e-cigarettes. If  you need help quitting, ask your health care provider.  You may have an exam or testing.  You may have a blood or urine sample taken.  You may need to have an enema to clean out your rectum and lower colon.  This procedure can affect the way you feel about yourself. Talk to your health care provider about the physical and emotional changes this procedure may cause. What happens during the procedure?  To lower your risk of infection: ? Your health care team will wash or sanitize their hands. ? Your skin will be washed with soap. ? Hair may be removed from the surgical area.  An IV tube will be inserted into one of your veins.  You will be given one or more of the following: ? A medicine to help you relax (sedative). ? A medicine to make you fall asleep (general anesthetic).  Tight-fitting (compression) stockings will be placed on your legs to promote circulation.  A thin, flexible tube (catheter) will be inserted to help drain your urine.  The surgeon will make a cut (incision) through the skin in your lower belly. The incision may go side-to-side or up-and-down.  The surgeon will move aside the body tissue that covers your uterus. The surgeon will then carefully take out your uterus along with any of the other organs that need to be removed.  Bleeding will be controlled with clamps or sutures.  The surgeon will close your incision with stitches (sutures), skin glue, or adhesive strips.  A bandage (dressing) will be placed over the incision. The procedure may vary among health care providers and hospitals. What happens after the procedure?  You will be given pain medicine as needed.  Your blood pressure, heart rate, breathing rate, and blood oxygen level will be monitored until the medicines you were given have worn off.  You will need to stay in the hospital to recover for one to two days. Ask your health care provider how long you will need to stay in the hospital after  your procedure.  You may have a liquid diet at first. You will most likely return to your usual diet the day after surgery.  You will still have the urinary catheter in place. It will likely be removed the day after surgery.  You may have to wear compression stockings. These stockings help to prevent blood clots and reduce swelling in your legs.  You will be encouraged to walk as soon as possible. You will also use a device or do breathing exercises to keep your lungs clear.  You may need to use a sanitary napkin for vaginal discharge. Summary  Abdominal hysterectomy is a surgical procedure to remove the womb (uterus). The uterus is the muscular organ that houses a developing baby.  This procedure can affect the way you feel about yourself. Talk to your health care provider about the physical and emotional changes this procedure may cause.  You will be given medicines for pain after the procedure.  You will need to stay in the hospital to recover. Ask your health care provider how long you will need to stay in the hospital  after your procedure. This information is not intended to replace advice given to you by your health care provider. Make sure you discuss any questions you have with your health care provider. Document Released: 09/01/2013 Document Revised: 08/15/2016 Document Reviewed: 08/15/2016 Elsevier Interactive Patient Education  2017 [employer].

## 2017-02-08 NOTE — H&P (Signed)
GYNECOLOGY PRE-OPERATIVE HISTORY PHYSICAL  Subjective:    Patient is a 51 y.o. G2P101female scheduled for total abdominal hysterectomy with bilateral salpingectomy. Indications for procedure are perimenopausal bleeding, enlarged fibroid uterus.   Pertinent Gynecological History: Menses: occurs regularly for 3-4 months, then skips 1-2 months.  Bleeding varies from moderate to heavy.  Bleeding: perimenopausal abnormal bleeding Contraception: tubal ligation Last mammogram: normal Date: 06/18/2016 Last pap: normal Date: 4//2016  Discussed Blood/Blood Products: yes   Menstrual History:  Menarche age: 32 Patient's last menstrual period was 01/21/2017.    Past Medical History:  Diagnosis Date  . Fibroid uterus     Past Surgical History:  Procedure Laterality Date  . CESAREAN SECTION  2001  . TUBAL LIGATION    . UTERINE FIBROID EMBOLIZATION  2011    OB History  Gravida Para Term Preterm AB Living  2 1 1   1 1   SAB TAB Ectopic Multiple Live Births    1     1    # Outcome Date GA Lbr Len/2nd Weight Sex Delivery Anes PTL Lv  2 TAB           1 Term      CS-LTranv  N LIV      Social History   Social History  . Marital status: Married    Spouse name: N/A  . Number of children: N/A  . Years of education: N/A   Social History Main Topics  . Smoking status: Never Smoker  . Smokeless tobacco: Never Used  . Alcohol use No  . Drug use: No  . Sexual activity: Yes    Birth control/ protection: Surgical   Other Topics Concern  . None   Social History Narrative  . None    Family History  Problem Relation Age of Onset  . Hypertension Mother   . Diabetes Mother   . Diabetes Father   . Breast cancer Neg Hx     Current Outpatient Prescriptions on File Prior to Visit  Medication Sig Dispense Refill  . Cholecalciferol (VITAMIN D PO) Take 2 tablets by mouth daily.    . ferrous sulfate 325 (65 FE) MG tablet Take 325 mg by mouth daily with breakfast.    . Multiple  Vitamin (MULTI-VITAMINS) TABS Take 1 tablet by mouth daily.     . Omega-3 Fatty Acids (FISH OIL PO) Take 2 capsules by mouth daily.     No current facility-administered medications on file prior to visit.     No Known Allergies  Review of Systems Constitutional: No recent fever/chills/sweats Respiratory: No recent cough/bronchitis Cardiovascular: No chest pain Gastrointestinal: No recent nausea/vomiting/diarrhea Genitourinary: No UTI symptoms Hematologic/lymphatic:No history of coagulopathy or recent blood thinner use    Objective:    BP 110/75 (BP Location: Left Arm, Patient Position: Sitting, Cuff Size: Normal)   Pulse 63   Ht 5\' 4"  (1.626 m)   Wt 162 lb 6.4 oz (73.7 kg)   LMP 01/21/2017   BMI 27.88 kg/m   General:   Normal  Skin:   normal  HEENT:  Normal  Neck:  Supple without Adenopathy or Thyromegaly  Lungs:   Heart:              Breasts:   Abdomen:  Pelvis:  M/S   Extremeties:  Neuro:    clear to auscultation bilaterally   Normal without murmur   Not Examined   soft, non-tender; bowel sounds normal;  no organomegaly.  Mass palpable up to 2-3 cm  below umbilicus, slightly mobile, regular contours.    Exam deferred to OR  No CVAT  Warm/Dry  Normal         Labs:  CBC Latest Ref Rng & Units 02/08/2017  WBC 3.6 - 11.0 K/uL 4.5  Hemoglobin 12.0 - 16.0 g/dL 11.7(L)  Hematocrit 35.0 - 47.0 % 36.8  Platelets 150 - 440 K/uL 252     Pathology (09/2016):  Diagnosis:  ENDOMETRIUM, BIOPSY:  DISORDERED PROLIFERATIVE PHASE ENDOMETRIUM WITH FOCAL STROMAL BREAKDOWN.  MINUTE FRAGMENTS OF BENIGN ENDOCERVICAL GLANDS, MUCUS, AND BLOOD. NO  ATYPICAL HYPERPLASIA OR CARCINOMA.     Imaging:  ULTRASOUND REPORT  Location: [facility] Date of Service: 09/28/16  Indications: Enlarged uterus, history of fibroids Findings:  The uterus is enlarged and measures 14.6 x 8.9 x 10.8. Echo texture is diffusely heterogenous with evidence of focal masses. Within  the uterus are multiple suspected fibroids measuring: Fibroid 1: 5.2 x 4.5 x 4.2 cm. Fibroid 2: 3.1 x 4.1 x 5.6 cm. Fibroid 3: 4.7 x 5 x 5.7 cm.   The Endometrium measures 3.2 mm.  Right and left ovaries were not visualized due to the enlarged uterus. Survey of the right adnexa demonstrates no adnexal mass. In the left adnexa is a . There is no free fluid in the cul de sac.  Impression: 1. Enlarged uterus with multiple fibroids, largest is 4.7x 5 x 5.7.          2. There is a mass in the left adnexa measuring 8.5 x 5.6 x 5.5 . Cannot determine as to whether this is an ovarian mass or a pedunculated fibroid.   Recommendations: 1.Clinical correlation with the patient's History and Physical Exam.  Assessment:    Perimenopausal bleeding Enlarged fibroid uterus   Plan:    Counseling: Procedure, risks, reasons, benefits and complications (including injury to bowel, bladder, major blood vessel, ureter, bleeding, possibility of transfusion, infection, or fistula formation) reviewed in detail. Consent to be signed at pre-op. Preop testing ordered. Instructions reviewed, including NPO after midnight.   Rubie Maid, MD [facility] 02/09/2017 10:28 PM

## 2017-02-09 LAB — RPR: RPR Ser Ql: NONREACTIVE

## 2017-02-09 LAB — HEPATITIS C ANTIBODY: HCV Ab: <0.1 s/co ratio (ref 0.0–0.9)

## 2017-02-09 NOTE — Progress Notes (Signed)
GYNECOLOGY PROGRESS NOTE  Subjective:    Patient ID: Ayde Record, female    DOB: 10/02/1965, 51 y.o.   MRN: [id number]  HPI  Patient is a 51 y.o. G38P1011 female who presents for pre-operative visit.  Patient is scheduled for TAH with bilateral salpingectomy for enlarged fibroid uterus, and abnormal perimenopausal bleeding.   The following portions of the patient's history were reviewed and updated as appropriate: allergies, current medications, past family history, past medical history, past social history, past surgical history and problem list.  Review of Systems Pertinent items noted in HPI and remainder of comprehensive ROS otherwise negative.   Objective:   Blood pressure 110/75, pulse 63, height 5\' 4"  (1.626 m), weight 162 lb 6.4 oz (73.7 kg), last menstrual period 01/21/2017. General appearance: alert and no distress Abdomen: normal findings: soft, non-tender and umbilicus normal and abnormal findings:  mass, located from the pelvis up to 2-3 cm below umbilicus Pelvic: deferred Extremities: extremities normal, atraumatic, no cyanosis or edema Neurologic: Grossly normal    Labs:  CBC Latest Ref Rng & Units 02/08/2017  WBC 3.6 - 11.0 K/uL 4.5      Labs:  01/17/2017 (Reviewed in Care Everywhere):   H/H - 12.3/40.1  TSH -  0.638  Pathology (09/2016):  Diagnosis:  ENDOMETRIUM, BIOPSY:  DISORDERED PROLIFERATIVE PHASE ENDOMETRIUM WITH FOCAL STROMAL BREAKDOWN.  MINUTE FRAGMENTS OF BENIGN ENDOCERVICAL GLANDS, MUCUS, AND BLOOD. NO  ATYPICAL HYPERPLASIA OR CARCINOMA.     Imaging:  ULTRASOUND REPORT  Location: [facility] Date of Service: 09/28/16  Indications: Enlarged uterus, history of fibroids Findings: The uterus is enlarged and measures 14.6 x 8.9 x 10.8. Echo texture is diffusely heterogenous with evidence of focal masses. Within the uterus are multiple suspected fibroids measuring: Fibroid 1: 5.2 x 4.5 x 4.2 cm. Fibroid 2: 3.1 x 4.1 x  5.6 cm. Fibroid 3: 4.7 x 5 x 5.7 cm.   The Endometriummeasures 3.2 mm.  Right and left ovaries were not visualized due to the enlarged uterus. Survey of the right adnexa demonstrates no adnexal mass. In the left adnexa is a . There is no free fluid in the cul de sac.  Impression: 1. Enlarged uterus with multiple fibroids, largest is 4.7x 5 x 5.7.  2. There is a mass in the left adnexa measuring 8.5 x 5.6 x 5.5 . Cannot determine as to whether this is an ovarian mass or a pedunculated fibroid.   Recommendations: 1.Clinical correlation with the patient's History and Physical Exam   Assessment:   Enlarged fibroid uterus Perimenopausal bleeding   Plan:   Patient desires surgical management with TAH with bilateral salpingectomy.  Surgery scheduled for 02/11/2017.  The risks of surgery were discussed in detail with the patient including but not limited to: bleeding which may require transfusion or reoperation; infection which may require prolonged hospitalization or re-hospitalization and antibiotic therapy; injury to bowel, bladder, ureters and major vessels or other surrounding organs; need for additional procedures including laparotomy; thromboembolic phenomenon, incisional problems and other postoperative or anesthesia complications.  Patient was told that the likelihood that her condition and symptoms will be treated effectively with this surgical management was very high; the postoperative expectations were also discussed in detail. The patient also understands the alternative treatment options which were discussed in full. All questions were answered.  Routine preoperative instructions of having nothing to eat or drink after midnight on the day prior to surgery and also coming to the hospital 1.5 hours prior to her  time of surgery were also emphasized.  She has a pre-admission visit today after this visit.      Rubie Maid, MD [facility]

## 2017-02-10 MED ORDER — CEFAZOLIN SODIUM-DEXTROSE 2-4 GM/100ML-% IV SOLN
2.0000 g | INTRAVENOUS | Status: AC
  Administered 2017-02-11: 2 g via INTRAVENOUS

## 2017-02-11 ENCOUNTER — Encounter
Admission: RE | Disposition: A | Discharge status: Home / Self Care | Payer: Self-pay | Source: Ambulatory Visit | Attending: Obstetrics and Gynecology

## 2017-02-11 ENCOUNTER — Inpatient Hospital Stay: Payer: Federal, State, Local not specified - PPO | Admitting: Anesthesiology

## 2017-02-11 ENCOUNTER — Encounter: Payer: Self-pay | Admitting: *Deleted

## 2017-02-11 ENCOUNTER — Inpatient Hospital Stay
Admission: RE | Admit: 2017-02-11 | Discharge: 2017-02-14 | DRG: 742 | Disposition: A | Discharge status: Home / Self Care | Payer: Federal, State, Local not specified - PPO | Source: Ambulatory Visit | Attending: Obstetrics and Gynecology | Admitting: Obstetrics and Gynecology

## 2017-02-11 DIAGNOSIS — N9962 Intraoperative hemorrhage and hematoma of a genitourinary system organ or structure complicating other procedure: Secondary | ICD-10-CM | POA: Diagnosis present

## 2017-02-11 DIAGNOSIS — D259 Leiomyoma of uterus, unspecified: Principal | ICD-10-CM | POA: Diagnosis present

## 2017-02-11 DIAGNOSIS — D62 Acute posthemorrhagic anemia: Secondary | ICD-10-CM | POA: Diagnosis not present

## 2017-02-11 DIAGNOSIS — N924 Excessive bleeding in the premenopausal period: Secondary | ICD-10-CM | POA: Diagnosis present

## 2017-02-11 DIAGNOSIS — Z9889 Other specified postprocedural states: Secondary | ICD-10-CM

## 2017-02-11 HISTORY — PX: HYSTERECTOMY ABDOMINAL WITH SALPINGECTOMY: SHX6725

## 2017-02-11 HISTORY — PX: CYSTOSCOPY: SHX5120

## 2017-02-11 LAB — HEMOGLOBIN AND HEMATOCRIT, BLOOD
HCT: 30.3 % — ABNORMAL LOW (ref 35.0–47.0)
HEMOGLOBIN: 9.9 g/dL — AB (ref 12.0–16.0)

## 2017-02-11 LAB — ABO/RH: ABO/RH(D): O POS

## 2017-02-11 LAB — POCT PREGNANCY, URINE: Preg Test, Ur: NEGATIVE

## 2017-02-11 LAB — PREPARE RBC (CROSSMATCH)

## 2017-02-11 SURGERY — HYSTERECTOMY, TOTAL, ABDOMINAL, WITH SALPINGECTOMY
Anesthesia: General | Wound class: Clean Contaminated

## 2017-02-11 MED ORDER — SUCCINYLCHOLINE CHLORIDE 20 MG/ML IJ SOLN
INTRAMUSCULAR | Status: DC | PRN
  Administered 2017-02-11: 100 mg via INTRAVENOUS

## 2017-02-11 MED ORDER — ACETAMINOPHEN NICU IV SYRINGE 10 MG/ML
INTRAVENOUS | Status: AC
  Filled 2017-02-11: qty 1

## 2017-02-11 MED ORDER — ONDANSETRON HCL 4 MG/2ML IJ SOLN
INTRAMUSCULAR | Status: DC | PRN
  Administered 2017-02-11: 4 mg via INTRAVENOUS

## 2017-02-11 MED ORDER — LACTATED RINGERS IV SOLN: INTRAVENOUS | Status: DC

## 2017-02-11 MED ORDER — DOCUSATE SODIUM 100 MG PO CAPS
100.0000 mg | ORAL_CAPSULE | Freq: Two times a day (BID) | ORAL | Status: DC
  Administered 2017-02-12 – 2017-02-14 (×5): 100 mg via ORAL
  Filled 2017-02-11 (×5): qty 1

## 2017-02-11 MED ORDER — LACTATED RINGERS IV SOLN
INTRAVENOUS | Status: DC
  Administered 2017-02-11: 07:00:00 via INTRAVENOUS

## 2017-02-11 MED ORDER — KETOROLAC TROMETHAMINE 30 MG/ML IJ SOLN
INTRAMUSCULAR | Status: AC
  Filled 2017-02-11: qty 1

## 2017-02-11 MED ORDER — MORPHINE SULFATE 2 MG/ML IV SOLN
INTRAVENOUS | Status: DC
  Administered 2017-02-11: 14:00:00 via INTRAVENOUS
  Administered 2017-02-11: 3.75 mg via INTRAVENOUS
  Administered 2017-02-11: 3 mL via INTRAVENOUS
  Administered 2017-02-12: 4.5 mg via INTRAVENOUS
  Administered 2017-02-12: 1.5 mg via INTRAVENOUS
  Administered 2017-02-12: 0.75 mg via INTRAVENOUS
  Administered 2017-02-12: 2.25 mg via INTRAVENOUS
  Filled 2017-02-11 (×4): qty 30

## 2017-02-11 MED ORDER — FENTANYL CITRATE (PF) 100 MCG/2ML IJ SOLN: 25.0000 ug | INTRAMUSCULAR | Status: DC | PRN

## 2017-02-11 MED ORDER — OXYCODONE-ACETAMINOPHEN 5-325 MG PO TABS
1.0000 | ORAL_TABLET | ORAL | Status: DC | PRN
  Administered 2017-02-12: 1 via ORAL
  Filled 2017-02-11: qty 2

## 2017-02-11 MED ORDER — DEXAMETHASONE SODIUM PHOSPHATE 10 MG/ML IJ SOLN
INTRAMUSCULAR | Status: DC | PRN
  Administered 2017-02-11: 5 mg via INTRAVENOUS

## 2017-02-11 MED ORDER — HYDROMORPHONE HCL 1 MG/ML IJ SOLN
INTRAMUSCULAR | Status: AC
  Filled 2017-02-11: qty 1

## 2017-02-11 MED ORDER — MENTHOL 3 MG MT LOZG
1.0000 | LOZENGE | OROMUCOSAL | Status: DC | PRN
  Filled 2017-02-11: qty 9

## 2017-02-11 MED ORDER — HYDROMORPHONE HCL 1 MG/ML IJ SOLN
0.2000 mg | INTRAMUSCULAR | Status: DC | PRN
  Administered 2017-02-11: 0.2 mg via INTRAVENOUS
  Filled 2017-02-11 (×2): qty 1

## 2017-02-11 MED ORDER — DIPHENHYDRAMINE HCL 50 MG/ML IJ SOLN: 12.5000 mg | Freq: Four times a day (QID) | INTRAMUSCULAR | Status: DC | PRN

## 2017-02-11 MED ORDER — FAMOTIDINE 20 MG PO TABS
ORAL_TABLET | ORAL | Status: AC
  Filled 2017-02-11: qty 1

## 2017-02-11 MED ORDER — ALUM & MAG HYDROXIDE-SIMETH 200-200-20 MG/5ML PO SUSP: 30.0000 mL | ORAL | Status: DC | PRN

## 2017-02-11 MED ORDER — DIPHENHYDRAMINE HCL 12.5 MG/5ML PO ELIX
12.5000 mg | ORAL_SOLUTION | Freq: Four times a day (QID) | ORAL | Status: DC | PRN
  Filled 2017-02-11: qty 5

## 2017-02-11 MED ORDER — ONDANSETRON HCL 4 MG/2ML IJ SOLN
4.0000 mg | Freq: Four times a day (QID) | INTRAMUSCULAR | Status: DC | PRN
  Administered 2017-02-11: 4 mg via INTRAVENOUS
  Filled 2017-02-11: qty 2

## 2017-02-11 MED ORDER — ONDANSETRON HCL 4 MG PO TABS
4.0000 mg | ORAL_TABLET | Freq: Four times a day (QID) | ORAL | Status: DC | PRN
  Administered 2017-02-12: 4 mg via ORAL
  Filled 2017-02-11: qty 1

## 2017-02-11 MED ORDER — ROCURONIUM BROMIDE 100 MG/10ML IV SOLN
INTRAVENOUS | Status: DC | PRN
  Administered 2017-02-11: 20 mg via INTRAVENOUS

## 2017-02-11 MED ORDER — LACTATED RINGERS IV BOLUS (SEPSIS)
500.0000 mL | Freq: Once | INTRAVENOUS | Status: AC
  Administered 2017-02-11: 500 mL via INTRAVENOUS

## 2017-02-11 MED ORDER — SENNOSIDES-DOCUSATE SODIUM 8.6-50 MG PO TABS
1.0000 | ORAL_TABLET | Freq: Every evening | ORAL | Status: DC | PRN
  Administered 2017-02-13: 1 via ORAL
  Filled 2017-02-11: qty 1

## 2017-02-11 MED ORDER — FERROUS SULFATE 325 (65 FE) MG PO TABS
325.0000 mg | ORAL_TABLET | Freq: Two times a day (BID) | ORAL | Status: DC
  Administered 2017-02-12 – 2017-02-14 (×5): 325 mg via ORAL
  Filled 2017-02-11 (×5): qty 1

## 2017-02-11 MED ORDER — GLYCOPYRROLATE 0.2 MG/ML IJ SOLN
INTRAMUSCULAR | Status: AC
  Filled 2017-02-11: qty 1

## 2017-02-11 MED ORDER — PHENYLEPHRINE HCL 10 MG/ML IJ SOLN
INTRAMUSCULAR | Status: AC
  Filled 2017-02-11: qty 1

## 2017-02-11 MED ORDER — ALBUMIN HUMAN 5 % IV SOLN
INTRAVENOUS | Status: DC | PRN
  Administered 2017-02-11 (×3): via INTRAVENOUS

## 2017-02-11 MED ORDER — SIMETHICONE 80 MG PO CHEW
80.0000 mg | CHEWABLE_TABLET | Freq: Four times a day (QID) | ORAL | Status: DC | PRN
  Administered 2017-02-13: 80 mg via ORAL
  Filled 2017-02-11: qty 1

## 2017-02-11 MED ORDER — FENTANYL CITRATE (PF) 100 MCG/2ML IJ SOLN
INTRAMUSCULAR | Status: AC
  Filled 2017-02-11: qty 2

## 2017-02-11 MED ORDER — SEVOFLURANE IN SOLN
RESPIRATORY_TRACT | Status: AC
  Filled 2017-02-11: qty 250

## 2017-02-11 MED ORDER — PROPOFOL 10 MG/ML IV BOLUS
INTRAVENOUS | Status: AC
  Filled 2017-02-11: qty 20

## 2017-02-11 MED ORDER — SODIUM CHLORIDE 0.9 % IV SOLN
INTRAVENOUS | Status: DC | PRN
  Administered 2017-02-11: 10:00:00 via INTRAVENOUS

## 2017-02-11 MED ORDER — ACETAMINOPHEN 10 MG/ML IV SOLN
INTRAVENOUS | Status: DC | PRN
  Administered 2017-02-11: 1000 mg via INTRAVENOUS

## 2017-02-11 MED ORDER — ATROPINE SULFATE 0.4 MG/ML IV SOSY
PREFILLED_SYRINGE | INTRAVENOUS | Status: AC
  Filled 2017-02-11: qty 2.5

## 2017-02-11 MED ORDER — FLUORESCEIN SODIUM 10 % IV SOLN
INTRAVENOUS | Status: DC | PRN
  Administered 2017-02-11: .5 mL via INTRAVENOUS

## 2017-02-11 MED ORDER — DEXAMETHASONE SODIUM PHOSPHATE 10 MG/ML IJ SOLN
INTRAMUSCULAR | Status: AC
  Filled 2017-02-11: qty 1

## 2017-02-11 MED ORDER — NALOXONE HCL 0.4 MG/ML IJ SOLN: 0.4000 mg | INTRAMUSCULAR | Status: DC | PRN

## 2017-02-11 MED ORDER — ONDANSETRON HCL 4 MG/2ML IJ SOLN
INTRAMUSCULAR | Status: AC
  Filled 2017-02-11: qty 2

## 2017-02-11 MED ORDER — LACTATED RINGERS IV SOLN
INTRAVENOUS | Status: DC
  Administered 2017-02-11 – 2017-02-12 (×4): via INTRAVENOUS

## 2017-02-11 MED ORDER — ROCURONIUM BROMIDE 50 MG/5ML IV SOLN
INTRAVENOUS | Status: AC
  Filled 2017-02-11: qty 1

## 2017-02-11 MED ORDER — CEFAZOLIN SODIUM-DEXTROSE 2-4 GM/100ML-% IV SOLN
INTRAVENOUS | Status: AC
  Filled 2017-02-11: qty 100

## 2017-02-11 MED ORDER — FENTANYL CITRATE (PF) 100 MCG/2ML IJ SOLN
INTRAMUSCULAR | Status: DC | PRN
  Administered 2017-02-11 (×3): 50 ug via INTRAVENOUS

## 2017-02-11 MED ORDER — HYDROMORPHONE HCL 1 MG/ML IJ SOLN
INTRAMUSCULAR | Status: DC | PRN
  Administered 2017-02-11 (×4): .25 mg via INTRAVENOUS

## 2017-02-11 MED ORDER — PANTOPRAZOLE SODIUM 40 MG PO TBEC
40.0000 mg | DELAYED_RELEASE_TABLET | Freq: Every day | ORAL | Status: DC
  Administered 2017-02-12 – 2017-02-14 (×3): 40 mg via ORAL
  Filled 2017-02-11 (×3): qty 1

## 2017-02-11 MED ORDER — ZOLPIDEM TARTRATE 5 MG PO TABS: 5.0000 mg | ORAL_TABLET | Freq: Every evening | ORAL | Status: DC | PRN

## 2017-02-11 MED ORDER — LIDOCAINE HCL (CARDIAC) 20 MG/ML IV SOLN
INTRAVENOUS | Status: DC | PRN
  Administered 2017-02-11: 100 mg via INTRAVENOUS

## 2017-02-11 MED ORDER — PROMETHAZINE HCL 25 MG/ML IJ SOLN: 6.2500 mg | INTRAMUSCULAR | Status: DC | PRN

## 2017-02-11 MED ORDER — FAMOTIDINE 20 MG PO TABS
20.0000 mg | ORAL_TABLET | Freq: Once | ORAL | Status: AC
  Administered 2017-02-11: 20 mg via ORAL

## 2017-02-11 MED ORDER — EPHEDRINE SULFATE 50 MG/ML IJ SOLN
INTRAMUSCULAR | Status: AC
  Filled 2017-02-11: qty 1

## 2017-02-11 MED ORDER — ALBUMIN HUMAN 5 % IV SOLN
INTRAVENOUS | Status: AC
  Filled 2017-02-11: qty 250

## 2017-02-11 MED ORDER — IBUPROFEN 600 MG PO TABS
600.0000 mg | ORAL_TABLET | Freq: Four times a day (QID) | ORAL | Status: DC | PRN
  Administered 2017-02-12 – 2017-02-13 (×5): 600 mg via ORAL
  Filled 2017-02-11 (×5): qty 1

## 2017-02-11 MED ORDER — BISACODYL 10 MG RE SUPP
10.0000 mg | Freq: Every day | RECTAL | Status: DC | PRN
Start: 2017-02-11 — End: 2017-02-14

## 2017-02-11 MED ORDER — PROPOFOL 10 MG/ML IV BOLUS
INTRAVENOUS | Status: DC | PRN
  Administered 2017-02-11: 150 mg via INTRAVENOUS

## 2017-02-11 MED ORDER — KETOROLAC TROMETHAMINE 30 MG/ML IJ SOLN: 30.0000 mg | Freq: Once | INTRAMUSCULAR | Status: DC

## 2017-02-11 MED ORDER — SUCCINYLCHOLINE CHLORIDE 20 MG/ML IJ SOLN
INTRAMUSCULAR | Status: AC
  Filled 2017-02-11: qty 1

## 2017-02-11 MED ORDER — SODIUM CHLORIDE 0.9 % IJ SOLN
INTRAMUSCULAR | Status: AC
  Filled 2017-02-11: qty 10

## 2017-02-11 MED ORDER — LIDOCAINE HCL (PF) 2 % IJ SOLN
INTRAMUSCULAR | Status: AC
  Filled 2017-02-11: qty 2

## 2017-02-11 MED ORDER — SODIUM CHLORIDE 0.9% FLUSH: 9.0000 mL | INTRAVENOUS | Status: DC | PRN

## 2017-02-11 MED ORDER — MIDAZOLAM HCL 2 MG/2ML IJ SOLN
INTRAMUSCULAR | Status: DC | PRN
  Administered 2017-02-11: 2 mg via INTRAVENOUS

## 2017-02-11 MED ORDER — MAGNESIUM CITRATE PO SOLN
1.0000 | Freq: Once | ORAL | Status: DC | PRN
  Filled 2017-02-11: qty 296

## 2017-02-11 MED ORDER — MIDAZOLAM HCL 2 MG/2ML IJ SOLN
INTRAMUSCULAR | Status: AC
  Filled 2017-02-11: qty 2

## 2017-02-11 MED ORDER — FLUORESCEIN SODIUM 10 % IV SOLN
INTRAVENOUS | Status: AC
  Filled 2017-02-11: qty 5

## 2017-02-11 MED ORDER — LIDOCAINE 5 % EX PTCH
MEDICATED_PATCH | CUTANEOUS | Status: AC
  Filled 2017-02-11: qty 1

## 2017-02-11 SURGICAL SUPPLY — 35 items
CANISTER SUCT 1200ML W/VALVE (MISCELLANEOUS) ×3 IMPLANT
CATH TRAY 16F METER LATEX (MISCELLANEOUS) ×3 IMPLANT
CHLORAPREP W/TINT 26ML (MISCELLANEOUS) ×3 IMPLANT
DRAPE LAPAROTOMY 100X77 ABD (DRAPES) ×3 IMPLANT
DRAPE LAPAROTOMY TRNSV 106X77 (MISCELLANEOUS) ×3 IMPLANT
DRAPE UNDER BUTTOCK W/FLU (DRAPES) ×3 IMPLANT
DRSG TELFA 3X8 NADH (GAUZE/BANDAGES/DRESSINGS) ×3 IMPLANT
ELECT BLADE 6 FLAT ULTRCLN (ELECTRODE) ×3 IMPLANT
ELECT REM PT RETURN 9FT ADLT (ELECTROSURGICAL) ×3
ELECTRODE REM PT RTRN 9FT ADLT (ELECTROSURGICAL) ×2 IMPLANT
GAUZE SPONGE 4X4 12PLY STRL (GAUZE/BANDAGES/DRESSINGS) ×3 IMPLANT
GLOVE BIO SURGEON STRL SZ 6.5 (GLOVE) ×3 IMPLANT
GLOVE INDICATOR 7.0 STRL GRN (GLOVE) ×3 IMPLANT
GOWN STRL REUS W/ TWL LRG LVL3 (GOWN DISPOSABLE) ×4 IMPLANT
GOWN STRL REUS W/TWL LRG LVL3 (GOWN DISPOSABLE) ×2
HANDLE SUCTION POOLE (INSTRUMENTS) ×2 IMPLANT
HEMOSTAT ARISTA ABSORB 3G PWDR (MISCELLANEOUS) ×3 IMPLANT
KIT RM TURNOVER CYSTO AR (KITS) ×3 IMPLANT
LABEL OR SOLS (LABEL) ×3 IMPLANT
LIGASURE IMPACT 36 18CM CVD LR (INSTRUMENTS) ×3 IMPLANT
NS IRRIG 1000ML POUR BTL (IV SOLUTION) ×3 IMPLANT
PACK BASIN MAJOR ARMC (MISCELLANEOUS) ×3 IMPLANT
SPONGE LAP 18X18 5 PK (GAUZE/BANDAGES/DRESSINGS) ×12 IMPLANT
STAPLER INSORB 30 2030 C-SECTI (MISCELLANEOUS) ×3 IMPLANT
STAPLER SKIN PROX 35W (STAPLE) ×3 IMPLANT
SUCTION POOLE HANDLE (INSTRUMENTS) ×3
SUT CHROMIC 0 CT 1 (SUTURE) ×3 IMPLANT
SUT MAXON ABS #0 GS21 30IN (SUTURE) ×3 IMPLANT
SUT VIC AB 0 CT1 27 (SUTURE) ×1
SUT VIC AB 0 CT1 27XCR 8 STRN (SUTURE) ×2 IMPLANT
SUT VIC AB 4-0 SH 27 (SUTURE) ×1
SUT VIC AB 4-0 SH 27XANBCTRL (SUTURE) ×2 IMPLANT
SUT VICRYL 3-0 CR8 SH (SUTURE) ×9 IMPLANT
SYR BULB IRRIG 60ML STRL (SYRINGE) ×3 IMPLANT
TRAY PREP VAG/GEN (MISCELLANEOUS) ×3 IMPLANT

## 2017-02-11 NOTE — Anesthesia Procedure Notes (Signed)
Procedure Name: Intubation Date/Time: 02/11/2017 7:50 AM Performed by: Zetta Bills Pre-anesthesia Checklist: Patient identified, Emergency Drugs available, Suction available and Patient being monitored Patient Re-evaluated:Patient Re-evaluated prior to inductionOxygen Delivery Method: Circle system utilized Preoxygenation: Pre-oxygenation with 100% oxygen Intubation Type: IV induction Ventilation: Mask ventilation without difficulty Laryngoscope Size: Mac and 3 Grade View: Grade I Tube type: Oral Tube size: 7.0 mm Number of attempts: 1 Airway Equipment and Method: Stylet Placement Confirmation: ETT inserted through vocal cords under direct vision Secured at: 21 cm Tube secured with: Tape Dental Injury: Teeth and Oropharynx as per pre-operative assessment

## 2017-02-11 NOTE — Op Note (Addendum)
Procedure(s): HYSTERECTOMY ABDOMINAL WITH BILATERAL SALPINGECTOMY CYSTOSCOPY Procedure Note  Lindsay Ward female 51 y.o. 02/11/2017  Indications: The patient is a 51 y.o. G26P1011 female with enlarged fibroid uterus and abnormal perimenopausal bleeding.   Pre-operative Diagnosis:  Enlarged fibroid uterus and abnormal perimenopausal bleeding.   Post-operative Diagnosis:  Enlarged fibroid uterus and abnormal perimenopausal bleeding.   Surgeon: Rubie Maid, MD  Assistants: Malachi Paradise, MD; Rojelio Brenner, [facility] NP student  Anesthesia: General endotracheal anesthesia  Findings: Exam under anesthesia: The uterus was noted to be enlarged and deviated to the right, approximately 18-20 week sized, minimal mobility (fixed).  The cervix was displaced anteriorly.  Intraoperatively: Enlarged fibroid (~ 10-12 cm in size) attached to ~ 10-12 week sized uterus at the lower uterine segment, with extension into the right pelvic sidewall and retroperitoneal space.  Fallopian tubes and ovaries appeared normal.  Cystoscopy: Bilateral efflux of fluorescein dye noted from ureteric orifices.   Procedure Details: The patient was seen in the Holding Room. The risks, benefits, complications, treatment options, and expected outcomes were discussed with the patient.  The patient concurred with the proposed plan, giving informed consent.  The site of surgery properly noted/marked. The patient was taken to the Operating Room, identified as Lindsay Ward and the procedure verified as Procedure(s) (LRB): HYSTERECTOMY ABDOMINAL WITH BILATERAL SALPINGECTOMY (N/A), CYSTOSCOPY. A Time Out was held and the above information confirmed.   After induction of anesthesia, the patient was draped and prepped in the usual sterile manner. Pt was placed in supine position after anesthesia and draped and prepped in the usual sterile manner. Foley catheter was placed.  A midline supra umbilical incision was made and carried  through the subcutaneous tissue to the fascia. Fascial incision was made and extended vertically. The rectus muscles were separated. The peritoneum was identified and entered sharply. Peritoneal incision was extended longitudinally.  The above findings were noted. The bowel was packed away from the surgical site.   A single tooth tenaculum was used to grasp the uterine fundus to aid with uterine manipulation.   The round ligaments were identified, ligated and cut using the Ligasure device. The left utero-ovarian ligament and proximal fallopian tube were grasped, ligated and cut using the Ligasure device.  Attempts to incise the anterior peritoneal reflection were attempted, however the tissue was noted to be thickened and the fibroid was noted to be obstructing and could not be properly dissected off.  The left utero-ovarian ligament and proximal fallopian tube were grasped, cut and suture ligated with 0-Vicryl.  Hemostasis was observed. The left side uterine vessels were skeletonized, then clamped, ligated. The large fibroid uterus was dissected from the lower uterine segment and right aspect of the uterus.  The fibroid was also dissected from deep within right pelvic sidewall into the retroperitoneal space.  After extensive dissection, a myomectomy was able to be performed.  After this, a bladder flap was able to be created. Serial pedicles of the cardinal and utero-sacral ligaments were clamped, cut, and suture ligated with 0-Vicryl. Entrance was made into the vagina and the uterus removed. Vaginal cuff angle sutures were placed incorporating the utero-sacral ligaments for support. The vaginal cuff was then closed with a running stitch of 0- Vicryl.  Bleeding was noted from several areas within the retroperiotoneal space.  These areas were cauterized with the bovie, and suture ligated with figure-of-eight sutures of 3-0 Vicryl.  There was oozing noted from the right infundibulopelvic ligament and the  surrounding retroperitoneal space.  The area was  reperitonealized with a running suture of 3-0 Vicryl.  Lavage was carried out until clear. Hemostasis was observed. Arista was placed over the vaginal cuff and retroperitoneal space.   All packing was removed from the abdomen. The fascia and peritoneum were approximated with two running sutures of 0-Maxon, beginning from distal edges and converging in the middle. Lavage was again carried out. Hemostasis was observed. The skin was approximated with absorbable staples.  Next a cystoscopy was performed using a 30 degree cystoscope.  Approximately 0.5 cc of fluorescein dye had been injected intravenously during incision closure.  The bladder was filled with approximately 200 ml of normal saline. The bladder was surveyed. There were no retained sutures in the bladder, the mucosa was intact with no signs of injury.  Bilateral efflux of the fluorescein dye was noted from the ureteric orifices.  The bladder was allowed to drain and the cystoscope was removed.    Instrument, sponge, and needle counts were correct prior to abdominal closure and at the conclusion of the case.   Estimated Blood Loss:  2100 ml      Drains: straight catheterization prior to procedure with 125 ml of clear urine         Total Input:  In: 2740 [I.V.:1400; Blood:590; IV Piggyback:750] (with 3 units of Albumin and 2 units PRBCs)  Specimens: Uterus with cervix, bilateral fallopian tubes, and large fibroid         Implants: None         Complications:  None; patient tolerated the procedure well.         Disposition: PACU - hemodynamically stable.         Condition: stable   Rubie Maid, MD [facility]

## 2017-02-11 NOTE — Progress Notes (Addendum)
Post-Operative Day #0, s/p TAH with bilateral salpingectomy, intra-operative hemorrhage (EBL 2100 ml), s/p blood transfusion 2 units PRBCs, and cystoscopy  Subjective: Notes pain, but just received pain medications.   Objective: Temp:  [96.9 F (36.1 C)-98.3 F (36.8 C)] 98.3 F (36.8 C) (06/04 1938) Pulse Rate:  [57-77] 73 (06/04 1938) Resp:  [8-18] 16 (06/04 1938) BP: (112-159)/(70-97) 122/80 (06/04 1938) SpO2:  [98 %-100 %] 98 % (06/04 1938) Weight:  [162 lb (73.5 kg)] 162 lb (73.5 kg) (06/04 0636)  Physical Exam:  General: alert and mild distress  Lungs: clear to auscultation bilaterally Heart: regular rate and rhythm, S1, S2 normal, no murmur, click, rub or gallop Abdomen: soft, non-tender; bowel sounds normal; no masses,  no organomegaly Pelvis:Bleeding: deferred,  Incision: Bandage clean and dry.  Extremities: DVT Evaluation: No evidence of DVT seen on physical exam.  SCDs in place.   CBC Latest Ref Rng & Units 02/11/2017 02/08/2017  WBC 3.6 - 11.0 K/uL - 4.5  Hemoglobin 12.0 - 16.0 g/dL 9.9(L) 11.7(L)  Hematocrit 35.0 - 47.0 % 30.3(L) 36.8  Platelets 150 - 440 K/uL - 252     Lab Results  Component Value Date   CREATININE 0.62 02/08/2017     Assessment/Plan: Continue routine post-operative care PCA pump for pain. Bedrest until a.m.  Monitor urine output Clear diet, advance as tolerated Strict I/O's.  SCDs for DVT prophylaxis.      Lindsay Maid, MD [facility]

## 2017-02-11 NOTE — Anesthesia Post-op Follow-up Note (Cosign Needed)
Anesthesia QCDR form completed.        

## 2017-02-11 NOTE — H&P (Signed)
UPDATE TO PREVIOUS HISTORY AND PHYSICAL  The patient has been seen and examined.  H&P is up to date, no changes noted. Di Jasmer  is scheduled for TAH with bilateral salpingectomy for enlarged fibroid uterus, and abnormal perimenopausal bleeding.  All questions answered. Patient can proceed to the OR for scheduled procedure.   Rubie Maid, MD  [facility] 02/11/2017 7:37 AM

## 2017-02-11 NOTE — Transfer of Care (Signed)
Immediate Anesthesia Transfer of Care Note  Patient: Lindsay Ward  Procedure(s) Performed: Procedure(s): HYSTERECTOMY ABDOMINAL WITH BILATERAL SALPINGECTOMY (N/A) CYSTOSCOPY  Patient Location: PACU  Anesthesia Type:General  Level of Consciousness: sedated  Airway & Oxygen Therapy: Patient Spontanous Breathing  Post-op Assessment: Report given to RN  Post vital signs: stable  Last Vitals:  Vitals:   02/11/17 0636 02/11/17 1127  BP: 112/77 140/88  Pulse: (!) 57 70  Resp: 18 14  Temp: (!) 36.1 C 36.5 C    Last Pain:  Vitals:   02/11/17 1127  TempSrc:   PainSc: Asleep     Past Medical History:  Diagnosis Date  . Anemia   . Fibroid uterus   . Medical history non-contributory    Past Surgical History:  Procedure Laterality Date  . CESAREAN SECTION  2001  . TUBAL LIGATION    . UTERINE FIBROID EMBOLIZATION  2011   Scheduled Meds: . ketorolac  30 mg Intravenous Once   Continuous Infusions: . lactated ringers    . lactated ringers 75 mL/hr at 02/11/17 0645   PRN Meds:.fentaNYL (SUBLIMAZE) injection, promethazine     Complications: No apparent anesthesia complications

## 2017-02-11 NOTE — Anesthesia Preprocedure Evaluation (Signed)
Anesthesia Evaluation  Patient identified by MRN, date of birth, ID band Patient awake    Reviewed: Allergy & Precautions, H&P , NPO status , Patient's Chart, lab work & pertinent test results, reviewed documented beta blocker date and time   History of Anesthesia Complications Negative for: history of anesthetic complications  Airway Mallampati: III  TM Distance: >3 FB Neck ROM: full    Dental  (+) Caps, Dental Advidsory Given, Teeth Intact   Pulmonary neg shortness of breath, neg sleep apnea, neg COPD, Recent URI , Resolved,           Cardiovascular Exercise Tolerance: Good negative cardio ROS       Neuro/Psych negative neurological ROS  negative psych ROS   GI/Hepatic negative GI ROS, Neg liver ROS,   Endo/Other  negative endocrine ROS  Renal/GU negative Renal ROS  negative genitourinary   Musculoskeletal   Abdominal   Peds  Hematology  (+) anemia ,   Anesthesia Other Findings Past Medical History: No date: Anemia No date: Fibroid uterus No date: Medical history non-contributory   Reproductive/Obstetrics negative OB ROS                             Anesthesia Physical Anesthesia Plan  ASA: I  Anesthesia Plan: General   Post-op Pain Management:    Induction:   Airway Management Planned:   Additional Equipment:   Intra-op Plan:   Post-operative Plan:   Informed Consent: I have reviewed the patients History and Physical, chart, labs and discussed the procedure including the risks, benefits and alternatives for the proposed anesthesia with the patient or authorized representative who has indicated his/her understanding and acceptance.   Dental Advisory Given  Plan Discussed with: Anesthesiologist, CRNA and Surgeon  Anesthesia Plan Comments:         Anesthesia Quick Evaluation

## 2017-02-12 ENCOUNTER — Encounter: Payer: Self-pay | Admitting: Obstetrics and Gynecology

## 2017-02-12 LAB — CBC
HCT: 26.3 % — ABNORMAL LOW (ref 35.0–47.0)
Hemoglobin: 8.7 g/dL — ABNORMAL LOW (ref 12.0–16.0)
MCH: 24.1 pg — ABNORMAL LOW (ref 26.0–34.0)
MCHC: 33 g/dL (ref 32.0–36.0)
MCV: 72.9 fL — ABNORMAL LOW (ref 80.0–100.0)
PLATELETS: 191 10*3/uL (ref 150–440)
RBC: 3.6 MIL/uL — AB (ref 3.80–5.20)
RDW: 16.1 % — ABNORMAL HIGH (ref 11.5–14.5)
WBC: 8.6 10*3/uL (ref 3.6–11.0)

## 2017-02-12 LAB — SURGICAL PATHOLOGY

## 2017-02-12 LAB — BPAM RBC
BLOOD PRODUCT EXPIRATION DATE: 201806302359
Blood Product Expiration Date: 201806072359
Blood Product Expiration Date: 201806072359
Blood Product Expiration Date: 201806302359
ISSUE DATE / TIME: 201806040918
ISSUE DATE / TIME: 201806040918
UNIT TYPE AND RH: 5100
UNIT TYPE AND RH: 5100
UNIT TYPE AND RH: 9500
Unit Type and Rh: 9500

## 2017-02-12 LAB — TYPE AND SCREEN
ABO/RH(D): O POS
Antibody Screen: NEGATIVE
UNIT DIVISION: 0
UNIT DIVISION: 0
Unit division: 0
Unit division: 0

## 2017-02-12 LAB — CREATININE, SERUM
CREATININE: 0.57 mg/dL (ref 0.44–1.00)
GFR calc non Af Amer: >60 mL/min (ref >60)

## 2017-02-12 LAB — PREPARE RBC (CROSSMATCH)

## 2017-02-12 NOTE — Anesthesia Postprocedure Evaluation (Signed)
Anesthesia Post Note  Patient: Lindsay Ward  Procedure(s) Performed: Procedure(s) (LRB): HYSTERECTOMY ABDOMINAL WITH BILATERAL SALPINGECTOMY (N/A) CYSTOSCOPY  Patient location during evaluation: PACU Anesthesia Type: General Level of consciousness: awake and alert Pain management: pain level controlled Vital Signs Assessment: post-procedure vital signs reviewed and stable Respiratory status: spontaneous breathing, nonlabored ventilation, respiratory function stable and patient connected to nasal cannula oxygen Cardiovascular status: blood pressure returned to baseline and stable Postop Assessment: no signs of nausea or vomiting Anesthetic complications: no     Last Vitals:  Vitals:   02/12/17 1628 02/12/17 1939  BP: 117/65 130/69  Pulse: 65 89  Resp: 18 18  Temp: 37 C 37.4 C    Last Pain:  Vitals:   02/12/17 2103  TempSrc:   PainSc: 0-No pain                 Martha Clan

## 2017-02-12 NOTE — Progress Notes (Signed)
Post-Operative Day #1, s/p TAH with bilateral salpingectomy, intra-operative hemorrhage (EBL 2100 ml), s/p blood transfusion 2 units PRBCs, and cystoscopy  Subjective: no complaints and tolerating liquids.  Has not tried solid foods yet.  Pain well controlled.  Has not passed flatus or had BM.   Objective: Temp:  [97.7 F (36.5 C)-98.4 F (36.9 C)] 98 F (36.7 C) (06/05 0749) Pulse Rate:  [60-78] 67 (06/05 0749) Resp:  [8-18] 17 (06/05 0754) BP: (112-159)/(67-97) 118/70 (06/05 0749) SpO2:  [94 %-100 %] 100 % (06/05 0754)  I/O last 3 completed shifts: In: 3140 [I.V.:1800; Blood:590; IV Piggyback:750] Out: 9924 [Urine:3215; Blood:2100] Total I/O In: 2405.5 [P.O.:118; I.V.:2287.5] Out: 500 [Urine:500]    Physical Exam:  General: alert and mild distress  Lungs: clear to auscultation bilaterally Heart: regular rate and rhythm, S1, S2 normal, no murmur, click, rub or gallop Abdomen: soft, non-tender; bowel sounds normal; no masses,  no organomegaly Pelvis:Bleeding: deferred,  Incision: Dressing clean and dry.  Extremities: DVT Evaluation: No evidence of DVT seen on physical exam.  SCDs in place.   CBC Latest Ref Rng & Units 02/12/2017 02/11/2017 02/08/2017  WBC 3.6 - 11.0 K/uL 8.6 - 4.5  Hemoglobin 12.0 - 16.0 g/dL 8.7(L) 9.9(L) 11.7(L)  Hematocrit 35.0 - 47.0 % 26.3(L) 30.3(L) 36.8  Platelets 150 - 440 K/uL 191 - 252     Lab Results  Component Value Date   CREATININE 0.57 02/12/2017   CREATININE 0.62 02/08/2017     Assessment/Plan: Continue routine post-operative care PCA pump for pain. Will discontinue and change to PO meds after 24 hrs post-op.  Can ambulate once foley catheter removed.  Clear diet, advance as tolerated. Monitor I/O's.  Patient's urine output improved overnight.  Can d/c foley.  SCDs for DVT prophylaxis.  Anemia secondary to intrapartum hemorrhage, s/p 2 units PRBCs.  Patient currently asymptomatic.  Will continue to treat with PO iron.   Dispo: likely  home in 2 days.      Rubie Maid, MD [facility]

## 2017-02-13 ENCOUNTER — Telehealth: Payer: Self-pay | Admitting: Obstetrics and Gynecology

## 2017-02-13 NOTE — Progress Notes (Signed)
Post-Operative Day #2, s/p TAH with bilateral salpingectomy, intra-operative hemorrhage (EBL 2100 ml), s/p blood transfusion 2 units PRBCs, and cystoscopy  Subjective: no complaints, tolerating PO and Pain well controlled.  Has passed flatus. Has not had BM   Objective: Temp:  [98.2 F (36.8 C)-99.4 F (37.4 C)] 98.6 F (37 C) (06/06 0755) Pulse Rate:  [59-89] 62 (06/06 0755) Resp:  [16-18] 18 (06/06 0755) BP: (98-130)/(55-69) 103/69 (06/06 0755) SpO2:  [98 %-100 %] 100 % (06/06 0755)   Physical Exam:  General: alert and mild distress  Lungs: clear to auscultation bilaterally Heart: regular rate and rhythm, S1, S2 normal, no murmur, click, rub or gallop Abdomen: soft, non-tender; bowel sounds normal; no masses,  no organomegaly Pelvis:Bleeding: deferred,  Incision: Dressing clean and dry.  Extremities: DVT Evaluation: No evidence of DVT seen on physical exam.  SCDs in place.   CBC Latest Ref Rng & Units 02/12/2017 02/11/2017 02/08/2017  WBC 3.6 - 11.0 K/uL 8.6 - 4.5  Hemoglobin 12.0 - 16.0 g/dL 8.7(L) 9.9(L) 11.7(L)  Hematocrit 35.0 - 47.0 % 26.3(L) 30.3(L) 36.8  Platelets 150 - 440 K/uL 191 - 252     Lab Results  Component Value Date   CREATININE 0.57 02/12/2017   CREATININE 0.62 02/08/2017     Assessment/Plan: Continue routine post-operative care Continue PO pain meds.  Can continue to ambulate as tolerated.  Regular diet as tolerated. SCDs for DVT prophylaxis.  Anemia secondary to intrapartum hemorrhage, s/p 2 units PRBCs.  Patient currently asymptomatic.  Will continue to treat with PO iron.   Dispo: likely home in 1 days.      Rubie Maid, MD [facility]

## 2017-02-13 NOTE — Telephone Encounter (Signed)
Patient called with questions regarding her bandage. She doesn't remember any instructions. Thanks

## 2017-02-13 NOTE — Telephone Encounter (Signed)
Patient is currently inpatient. Contacted patient's nurse to check on incision to see if any problems noted.  Can place gauze or Telfa around any concerning areas.  Otherwise incision can be open to air.  Will place order for Lidoderm patch.

## 2017-02-14 MED ORDER — DOCUSATE SODIUM 100 MG PO CAPS: 100.0000 mg | ORAL_CAPSULE | Freq: Two times a day (BID) | ORAL | 2 refills | Status: DC | PRN

## 2017-02-14 MED ORDER — IBUPROFEN 600 MG PO TABS: 600.0000 mg | ORAL_TABLET | Freq: Four times a day (QID) | ORAL | 1 refills | Status: DC | PRN

## 2017-02-14 MED ORDER — LIDOCAINE 5 % EX PTCH
1.0000 | MEDICATED_PATCH | CUTANEOUS | Status: DC
  Administered 2017-02-14: 1 via TRANSDERMAL
  Filled 2017-02-14: qty 1

## 2017-02-14 MED ORDER — FERROUS SULFATE 325 (65 FE) MG PO TABS: 325.0000 mg | ORAL_TABLET | Freq: Two times a day (BID) | ORAL | 2 refills | Status: DC

## 2017-02-14 MED ORDER — OXYCODONE-ACETAMINOPHEN 5-325 MG PO TABS: 1.0000 | ORAL_TABLET | Freq: Four times a day (QID) | ORAL | 0 refills | Status: DC | PRN

## 2017-02-14 NOTE — Discharge Instructions (Signed)
General Gynecological Post-Operative Instructions You may expect to feel dizzy, weak, and drowsy for as long as 24 hours after receiving the medicine that made you sleep (anesthetic).  Do not drive a car, ride a bicycle, participate in physical activities, or take public transportation until you are done taking narcotic pain medicines or as directed by your doctor.  Do not drink alcohol or take tranquilizers.  Do not take medicine that has not been prescribed by your doctor.  Do not sign important papers or make important decisions while on narcotic pain medicines.  Have a responsible person with you.  CARE OF INCISION  Keep incision clean and dry. Take showers instead of baths until your doctor gives you permission to take baths.  Avoid heavy lifting (more than 10 pounds/4.5 kilograms), pushing, or pulling.  Avoid activities that may risk injury to your surgical site.  No sexual intercourse or placement of anything in the vagina for 6-8 weeks or as instructed by your doctor. If you have tubes coming from the wound site, check with your doctor regarding appropriate care of the tubes. Only take prescription or over-the-counter medicines  for pain, discomfort, or fever as directed by your doctor. Do not take aspirin. It can make you bleed. Take medicines (antibiotics) that kill germs if they are prescribed for you.  Call the office or go to the MAU if:  You feel sick to your stomach (nauseous).  You start to throw up (vomit).  You have trouble eating or drinking.  You have an oral temperature above 101.  You have constipation that is not helped by adjusting diet or increasing fluid intake. Pain medicines are a common cause of constipation.  You have any other concerns. SEEK IMMEDIATE MEDICAL CARE IF:  You have persistent dizziness.  You have difficulty breathing or a congested sounding (croupy) cough.  You have an oral temperature above 102.5, not controlled by medicine.  There is increasing  pain or tenderness near or in the surgical site.

## 2017-02-14 NOTE — Discharge Summary (Signed)
Gynecology Physician Postoperative Discharge Summary  Patient ID: Lindsay Ward MRN: [id number] DOB/AGE: [birth year] 51 y.o.  Admit Date: 02/11/2017 Discharge Date: 02/14/2017  Preoperative Diagnoses: Abnormal perimenopausal bleeding, Enlarged fibroid uterus  Procedures: Procedure(s) (LRB): HYSTERECTOMY ABDOMINAL WITH BILATERAL SALPINGECTOMY (N/A) CYSTOSCOPY  Hospital Course:  Lindsay Ward is a 51 y.o. G2P1011  admitted for scheduled surgery.  She underwent the procedures as mentioned above Her operation was complicated by intraoperative hemorrhage (EBL 2100 ml).  She was transfused intra-operatively 2 units PRBCs and 3 units of albumin. For further details about surgery, please refer to the operative report. Patient had an uncomplicated postoperative course. By time of discharge on POD#3, her pain was controlled on oral pain medications; she was ambulating, voiding without difficulty, tolerating regular diet and passing flatus. She was deemed stable for discharge to home.   Significant Labs: CBC Latest Ref Rng & Units 02/12/2017 02/11/2017 02/08/2017  WBC 3.6 - 11.0 K/uL 8.6 - 4.5  Hemoglobin 12.0 - 16.0 g/dL 8.7(L) 9.9(L) 11.7(L)  Hematocrit 35.0 - 47.0 % 26.3(L) 30.3(L) 36.8  Platelets 150 - 440 K/uL 191 - 252    Discharge Exam: Blood pressure 104/67, pulse (!) 58, temperature 98.3 F (36.8 C), temperature source Oral, resp. rate 18, height 5\' 4"  (1.626 m), weight 162 lb (73.5 kg), last menstrual period 02/08/2017, SpO2 100 %. General appearance: alert and no distress  Resp: clear to auscultation bilaterally  Cardio: regular rate and rhythm  GI: soft, non-tender; bowel sounds normal; no masses, no organomegaly.  Incision: C/D/I, no erythema, no drainage noted Pelvic: scant blood on pad  Extremities: extremities normal, atraumatic, no cyanosis or edema and Homans sign is negative, no sign of DVT  Discharged Condition: Stable  Disposition: Home   Allergies as of 02/14/2017   No Known  Allergies     Medication List    TAKE these medications   docusate sodium 100 MG capsule Commonly known as:  COLACE Take 1 capsule (100 mg total) by mouth 2 (two) times daily as needed.   ferrous sulfate 325 (65 FE) MG tablet Take 1 tablet (325 mg total) by mouth 2 (two) times daily with a meal. What changed:  when to take this   FISH OIL PO Take 2 capsules by mouth daily.   ibuprofen 600 MG tablet Commonly known as:  ADVIL,MOTRIN Take 1 tablet (600 mg total) by mouth every 6 (six) hours as needed (mild pain).   MULTI-VITAMINS Tabs Take 1 tablet by mouth daily.   oxyCODONE-acetaminophen 5-325 MG tablet Commonly known as:  PERCOCET/ROXICET Take 1-2 tablets by mouth every 6 (six) hours as needed for severe pain (moderate to severe pain (when tolerating fluids)).   VITAMIN D PO Take 2 tablets by mouth daily.      Follow-up Information    Rubie Maid, MD Follow up in 1 week(s).   Specialties:  Obstetrics and Gynecology, Radiology Why:  Incision check Contact information: [address] [place] [address] [phone]           Signed: Rubie Maid, MD [facility]

## 2017-02-14 NOTE — Progress Notes (Signed)
D/C instructions provided, pt states understanding, aware of follow up appt.  Prescription given to pt.   

## 2017-02-14 NOTE — Progress Notes (Signed)
D/C home to car via auxiliary in wheelchair.  

## 2017-02-14 NOTE — Progress Notes (Signed)
Post-Operative Day #3, s/p TAH with bilateral salpingectomy, intra-operative hemorrhage (EBL 2100 ml), s/p blood transfusion 2 units PRBCs, and cystoscopy  Subjective: no complaints, tolerating PO and Pain well controlled.  Has passed flatus. Has not had BM   Objective: Vitals:   02/13/17 1948 02/13/17 2000 02/13/17 2328 02/14/17 0345  BP: 113/70   104/67  Pulse: 71  64 (!) 58  Resp: 18   18  Temp: 98.8 F (37.1 C)  98.7 F (37.1 C) 98.3 F (36.8 C)  TempSrc: Oral  Oral Oral  SpO2: (!) 85% 99% 100% 100%  Weight:      Height:         Physical Exam:  General: alert and no distress  Lungs: clear to auscultation bilaterally Heart: regular rate and rhythm, S1, S2 normal, no murmur, click, rub or gallop Abdomen: soft, non-tender; bowel sounds normal; no masses,  no organomegaly Pelvis:Bleeding: deferred,  Incision: Old dry blood, intact, slightly moist.  Extremities: DVT Evaluation: No evidence of DVT seen on physical exam.  Calves non-tender, no Homans' sign.   CBC Latest Ref Rng & Units 02/12/2017 02/11/2017 02/08/2017  WBC 3.6 - 11.0 K/uL 8.6 - 4.5  Hemoglobin 12.0 - 16.0 g/dL 8.7(L) 9.9(L) 11.7(L)  Hematocrit 35.0 - 47.0 % 26.3(L) 30.3(L) 36.8  Platelets 150 - 440 K/uL 191 - 252     Lab Results  Component Value Date   CREATININE 0.57 02/12/2017   CREATININE 0.62 02/08/2017     Assessment/Plan: Continue routine post-operative care Continue PO pain meds.  Can continue to ambulate as tolerated.  Regular diet as tolerated. Anemia secondary to intrapartum hemorrhage, s/p 2 units PRBCs.  Patient currently asymptomatic.  Will continue to treat with PO iron.   Dispo: home today.      Rubie Maid, MD [facility]

## 2017-02-22 ENCOUNTER — Ambulatory Visit (INDEPENDENT_AMBULATORY_CARE_PROVIDER_SITE_OTHER): Payer: Federal, State, Local not specified - PPO | Admitting: Obstetrics and Gynecology

## 2017-02-22 ENCOUNTER — Encounter: Payer: Self-pay | Admitting: Obstetrics and Gynecology

## 2017-02-22 VITALS — BP 105/61 | HR 60 | Ht 64.0 in | Wt 153.6 lb

## 2017-02-22 DIAGNOSIS — Z90710 Acquired absence of both cervix and uterus: Secondary | ICD-10-CM

## 2017-02-22 DIAGNOSIS — N809 Endometriosis, unspecified: Secondary | ICD-10-CM

## 2017-02-22 DIAGNOSIS — N8003 Adenomyosis of the uterus: Secondary | ICD-10-CM

## 2017-02-22 DIAGNOSIS — Z9889 Other specified postprocedural states: Secondary | ICD-10-CM

## 2017-02-22 DIAGNOSIS — N800 Endometriosis of uterus: Secondary | ICD-10-CM

## 2017-02-22 NOTE — Progress Notes (Signed)
    OBSTETRICS/GYNECOLOGY POST-OPERATIVE CLINIC VISIT  Subjective:     Lindsay Ward is a 51 y.o. female who presents to the clinic 1 weeks status post total abdominal hysterectomy and bilateral salpingectomy for fibroids and perimenopausal bleeding. Eating a regular diet without difficulty. Bowel movements are normal. Pain is controlled with current analgesics. Medications being used: prescription NSAID's including ibuprofen (Motrin).  The following portions of the patient's history were reviewed and updated as appropriate: allergies, current medications, past family history, past medical history, past social history, past surgical history and problem list.   Review of Systems Pertinent items noted in HPI and remainder of comprehensive ROS otherwise negative.    Objective:    BP 105/61 (BP Location: Left Arm, Patient Position: Sitting, Cuff Size: Normal)   Pulse 60   Ht 5\' 4"  (1.626 m)   Wt 153 lb 9.6 oz (69.7 kg)   LMP 02/08/2017   BMI 26.37 kg/m  General:  alert and no distress  Abdomen: soft, bowel sounds active, non-tender  Incision:   healing well, no drainage, no erythema, no hernia, no seroma, no swelling, no dehiscence, incision well approximated    Pathology:   A. UTERUS WITH CERVIX, LARGE FIBROID AND BILATERAL FALLOPIAN TUBES;  HYSTERECTOMY WITH BILATERAL SALPINGECTOMY:  - CERVIX WITHIN NORMAL LIMITS.  - PROLIFERATIVE ENDOMETRIUM.  - PROMINENT ADENOMYOSIS.  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  - BILATERAL FALLOPIAN TUBES WITH VASCULAR CONGESTION.  - UNILATERAL FALLOPIAN TUBE WITH PARATUBAL CYSTS.   Assessment:    Doing well postoperatively.   Plan:   1. Continue any current medications. 2. Wound care discussed. 3. Operative findings again reviewed. Pathology report discussed. 4. Activity restrictions: no bending, stooping, or squatting and no lifting more than 10-15 pounds 5.  Anticipated return to work: 4 weeks. 6. Follow up: 4 weeks for final post-op check.     Rubie Maid, MD [facility]

## 2017-02-27 ENCOUNTER — Encounter: Payer: Federal, State, Local not specified - PPO | Admitting: Obstetrics and Gynecology

## 2017-02-28 ENCOUNTER — Encounter: Payer: Self-pay | Admitting: Obstetrics and Gynecology

## 2017-02-28 ENCOUNTER — Ambulatory Visit (INDEPENDENT_AMBULATORY_CARE_PROVIDER_SITE_OTHER): Payer: Federal, State, Local not specified - PPO | Admitting: Obstetrics and Gynecology

## 2017-02-28 VITALS — BP 112/68 | HR 67 | Ht 64.0 in | Wt 151.1 lb

## 2017-02-28 DIAGNOSIS — Z5189 Encounter for other specified aftercare: Secondary | ICD-10-CM

## 2017-02-28 NOTE — Progress Notes (Signed)
    OBSTETRICS/GYNECOLOGY POST-OPERATIVE CLINIC VISIT  Subjective:     Lindsay Ward is a 51 y.o. female who presents to the clinic 2 weeks status post total abdominal hysterectomy and bilateral salpingectomy for fibroids and perimenopausal bleeding. Patient notes pain is controlled with pain meds. She complains of yellowish-green discharge coming from incision site near navel over past several days. Denies fevers, chills, tenderness at site.  The following portions of the patient's history were reviewed and updated as appropriate: allergies, current medications, past family history, past medical history, past social history, past surgical history and problem list.   Review of Systems Pertinent items noted in HPI and remainder of comprehensive ROS otherwise negative.    Objective:    BP 112/68   Pulse 67   Ht 5\' 4"  (1.626 m)   Wt 151 lb 1 oz (68.5 kg)   LMP 02/08/2017   BMI 25.93 kg/m  General:  alert and no distress  Abdomen: soft, bowel sounds active, non-tender  Incision:   healing well, scant yellowish drainage at umbilical region of incision near scab, no erythema, no hernia, no seroma, no swelling, no dehiscence, incision well approximated.     Assessment:    Doing well postoperatively.  Wound check  Plan:   1. Continue any current medications. 2. Wound care discussed.  Advised to clean area with peroxide each day as last of the area attempts to scab and heal.  Drainage likely secondary to healing. No odor or signs of infection.  3. Activity restrictions: no bending, stooping, or squatting and no lifting more than 10-15 pounds 5. Anticipated return to work: 2-3 weeks. 6. Follow up: 3 weeks for final post-op check.     Rubie Maid, MD [facility]

## 2017-02-28 NOTE — Progress Notes (Signed)
Pt has a poss infection on incision line dark yellow- greenish. Denies itching or discomfort.

## 2017-03-22 ENCOUNTER — Ambulatory Visit (INDEPENDENT_AMBULATORY_CARE_PROVIDER_SITE_OTHER): Payer: Federal, State, Local not specified - PPO | Admitting: Obstetrics and Gynecology

## 2017-03-22 VITALS — BP 104/72 | HR 70 | Ht 64.0 in | Wt 151.2 lb

## 2017-03-22 DIAGNOSIS — Z4889 Encounter for other specified surgical aftercare: Secondary | ICD-10-CM

## 2017-03-22 DIAGNOSIS — Z90710 Acquired absence of both cervix and uterus: Secondary | ICD-10-CM

## 2017-03-22 NOTE — Progress Notes (Signed)
    OBSTETRICS/GYNECOLOGY POST-OPERATIVE CLINIC VISIT  Subjective:     Lindsay Ward is a 51 y.o. female who presents to the clinic 6 weeks status post total abdominal hysterectomy and bilateral salpingectomy for fibroids and perimenopausal bleeding. Eating a regular diet without difficulty. Bowel movements are normal. The patient is not having any pain.  The following portions of the patient's history were reviewed and updated as appropriate: allergies, current medications, past family history, past medical history, past social history, past surgical history and problem list.   Review of Systems Pertinent items noted in HPI and remainder of comprehensive ROS otherwise negative.    Objective:    BP 104/72 (BP Location: Left Arm, Patient Position: Sitting, Cuff Size: Normal)   Pulse 70   Ht 5\' 4"  (1.626 m)   Wt 151 lb 3.2 oz (68.6 kg)   LMP 02/08/2017   BMI 25.95 kg/m  General:  alert and no distress  Abdomen: soft, bowel sounds active, non-tender  Incision:   healing well, no drainage, no erythema, no hernia, no seroma, no swelling, no dehiscence, incision well approximated.  Small scab at umbilical region.  Pelvis:  .external genitalia normal. Vagina with small amount of thin white discharge, no odor.  Vaginal cuff well-healed.  Uterus and cervix surgically absent. Bimanual exam not performed.     Pathology:  A. UTERUS WITH CERVIX, LARGE FIBROID AND BILATERAL FALLOPIAN TUBES;  HYSTERECTOMY WITH BILATERAL SALPINGECTOMY:  - CERVIX WITHIN NORMAL LIMITS.  - PROLIFERATIVE ENDOMETRIUM.  - PROMINENT ADENOMYOSIS.  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  - BILATERAL FALLOPIAN TUBES WITH VASCULAR CONGESTION.  - UNILATERAL FALLOPIAN TUBE WITH PARATUBAL CYSTS.   Assessment:   Doing well postoperatively. S/p TAH with bilateral salpingectomy   Plan:   1  Activity restrictions: none 2. Anticipated return to  work: 2 weeks. 3. Follow up: 1 year for annual exam.     Rubie Maid, MD [facility]

## 2017-04-02 ENCOUNTER — Telehealth: Payer: Self-pay | Admitting: Obstetrics and Gynecology

## 2017-04-02 NOTE — Telephone Encounter (Signed)
Called pt to inquire as to why she is requesting a note for work as she was doing well at her final post op and denied pain at that time. Pt states that she has returned to work full time 10-12 hour days. Pt states that she commutes to work (an hour to and an hour from) pt states that she is very sore after the car ride and feels like she may have resumed driving and working full time too soon. Pt requests to be able to work half days for a week and them start with 6-8 hours a day and work her way up. Will provide. Pt to come pick up.

## 2017-04-02 NOTE — Telephone Encounter (Signed)
Patient needs a work note to extend your time off work due to the pain and discomfort while riding   Please call

## 2017-05-15 ENCOUNTER — Other Ambulatory Visit: Payer: Self-pay | Admitting: Obstetrics and Gynecology

## 2017-05-15 DIAGNOSIS — Z1231 Encounter for screening mammogram for malignant neoplasm of breast: Secondary | ICD-10-CM

## 2017-06-17 ENCOUNTER — Ambulatory Visit
Admission: RE | Admit: 2017-06-17 | Discharge: 2017-06-17 | Disposition: A | Discharge status: Home / Self Care | Payer: Federal, State, Local not specified - PPO | Source: Ambulatory Visit | Attending: Obstetrics and Gynecology | Admitting: Obstetrics and Gynecology

## 2017-06-17 DIAGNOSIS — Z1231 Encounter for screening mammogram for malignant neoplasm of breast: Secondary | ICD-10-CM

## 2017-12-02 ENCOUNTER — Other Ambulatory Visit: Payer: Self-pay | Admitting: Obstetrics and Gynecology

## 2017-12-02 DIAGNOSIS — Z1231 Encounter for screening mammogram for malignant neoplasm of breast: Secondary | ICD-10-CM

## 2017-12-19 ENCOUNTER — Encounter: Payer: Federal, State, Local not specified - PPO | Admitting: Obstetrics and Gynecology

## 2017-12-31 ENCOUNTER — Encounter: Payer: Federal, State, Local not specified - PPO | Admitting: Obstetrics and Gynecology

## 2018-01-01 ENCOUNTER — Ambulatory Visit (INDEPENDENT_AMBULATORY_CARE_PROVIDER_SITE_OTHER): Payer: Federal, State, Local not specified - PPO | Admitting: Obstetrics and Gynecology

## 2018-01-01 ENCOUNTER — Encounter: Payer: Self-pay | Admitting: Obstetrics and Gynecology

## 2018-01-01 VITALS — BP 93/65 | HR 55 | Ht 64.0 in | Wt 160.5 lb

## 2018-01-01 DIAGNOSIS — E663 Overweight: Secondary | ICD-10-CM | POA: Diagnosis not present

## 2018-01-01 DIAGNOSIS — Z862 Personal history of diseases of the blood and blood-forming organs and certain disorders involving the immune mechanism: Secondary | ICD-10-CM | POA: Diagnosis not present

## 2018-01-01 DIAGNOSIS — Z01419 Encounter for gynecological examination (general) (routine) without abnormal findings: Secondary | ICD-10-CM

## 2018-01-01 NOTE — Progress Notes (Signed)
Pt noticed that she still feel some pulling when she bend over or move from her hysterectomy.

## 2018-01-01 NOTE — Progress Notes (Signed)
GYNECOLOGY ANNUAL PHYSICAL EXAM PROGRESS NOTE  Subjective:    Lindsay Ward is a 52 y.o. G54P1011 female who presents for an annual exam. The patient is sexually active. The patient has no major complaints today. The patient wears seatbelts: yes. The patient participates in regular exercise: yes. Has the patient ever been transfused or tattooed?: no. The patient reports that there is not domestic violence in her life.    Gynecologic History: Menarche age: 59 or 52 Patient's last menstrual period was 02/08/2017. Contraception: status post hysterectomy in 2018 History of STI's: Denies Last Pap: 12/2014. Results were: normal.  Denies h/o abnormal pap smears. Last mammogram: 06/2017. Results were: normal Last colonoscopy: 04/2015.  Results were: normal   OB History  Gravida Para Term Preterm AB Living  2 1 1  0 1 1  SAB TAB Ectopic Multiple Live Births  0 1 0 0 1    # Outcome Date GA Lbr Len/2nd Weight Sex Delivery Anes PTL Lv  2 TAB           1 Term      CS-LTranv  N LIV    Past Medical History:  Diagnosis Date  . Anemia   . Fibroid uterus     Past Surgical History:  Procedure Laterality Date  . CESAREAN SECTION  2001  . CYSTOSCOPY  02/11/2017   Procedure: CYSTOSCOPY;  Surgeon: Rubie Maid, MD;  Location: [facility];  Service: Gynecology;;  . HYSTERECTOMY ABDOMINAL WITH SALPINGECTOMY N/A 02/11/2017   Procedure: HYSTERECTOMY ABDOMINAL WITH BILATERAL SALPINGECTOMY;  Surgeon: Rubie Maid, MD;  Location: [facility];  Service: Gynecology;  Laterality: N/A;  . TUBAL LIGATION    . UTERINE FIBROID EMBOLIZATION  2011    Family History  Problem Relation Age of Onset  . Hypertension Mother   . Diabetes Mother   . Diabetes Father   . Breast cancer Neg Hx     Social History   Socioeconomic History  . Marital status: Married    Spouse name: Not on file  . Number of children: Not on file  . Years of education: Not on file  . Highest education level: Not on file  Occupational  History  . Not on file  Social Needs  . Financial resource strain: Not on file  . Food insecurity:    Worry: Not on file    Inability: Not on file  . Transportation needs:    Medical: Not on file    Non-medical: Not on file  Tobacco Use  . Smoking status: Never Smoker  . Smokeless tobacco: Never Used  Substance and Sexual Activity  . Alcohol use: No  . Drug use: No  . Sexual activity: Yes    Birth control/protection: Surgical  Lifestyle  . Physical activity:    Days per week: Not on file    Minutes per session: Not on file  . Stress: Not on file  Relationships  . Social connections:    Talks on phone: Not on file    Gets together: Not on file    Attends religious service: Not on file    Active member of club or organization: Not on file    Attends meetings of clubs or organizations: Not on file    Relationship status: Not on file  . Intimate partner violence:    Fear of current or ex partner: Not on file    Emotionally abused: Not on file    Physically abused: Not on file    Forced sexual  activity: Not on file  Other Topics Concern  . Not on file  Social History Narrative  . Not on file    Current Outpatient Medications on File Prior to Visit  Medication Sig Dispense Refill  . Cholecalciferol (VITAMIN D PO) Take 2 tablets by mouth daily.    . ferrous sulfate 325 (65 FE) MG tablet Take 1 tablet (325 mg total) by mouth 2 (two) times daily with a meal. 60 tablet 2  . FIBER PO Take by mouth.    . Multiple Vitamin (MULTI-VITAMINS) TABS Take 1 tablet by mouth daily.      No current facility-administered medications on file prior to visit.     No Known Allergies    Review of Systems Constitutional: negative for chills, fatigue, fevers and sweats Eyes: negative for irritation, redness and visual disturbance Ears, nose, mouth, throat, and face: negative for hearing loss, nasal congestion, snoring and tinnitus Respiratory: negative for asthma, cough,  sputum Cardiovascular: negative for chest pain, dyspnea, exertional chest pressure/discomfort, irregular heart beat, palpitations and syncope Gastrointestinal: negative for abdominal pain, change in bowel habits, nausea and vomiting Genitourinary: negative for abnormal menstrual periods, genital lesions, sexual problems and vaginal discharge, dysuria and urinary incontinence Integument/breast: negative for breast lump, breast tenderness and nipple discharge Hematologic/lymphatic: negative for bleeding and easy bruising Musculoskeletal:negative for back pain and muscle weakness. Positive for occasional tugging sensation in abdomen near incision site from hysterectomy. Mostly with moderate exertion or exercise. Overall otherwise not bothersome. Neurological: negative for dizziness, headaches, vertigo and weakness Endocrine: negative for diabetic symptoms including polydipsia, polyuria and skin dryness Allergic/Immunologic: negative for hay fever and urticaria      Objective:  Blood pressure 93/65, pulse (!) 55, height 5\' 4"  (1.626 m), weight 160 lb 8 oz (72.8 kg), last menstrual period 02/08/2017. Body mass index is 27.55 kg/m.  General Appearance:    Alert, cooperative, no distress, appears stated age  Head:    Normocephalic, without obvious abnormality, atraumatic  Eyes:    PERRL, conjunctiva/corneas clear, EOM's intact, both eyes  Ears:    Normal external ear canals, both ears  Nose:   Nares normal, septum midline, mucosa normal, no drainage or sinus tenderness  Throat:   Lips, mucosa, and tongue normal; teeth and gums normal  Neck:   Supple, symmetrical, trachea midline, no adenopathy; thyroid: no enlargement/tenderness/nodules; no carotid bruit or JVD  Back:     Symmetric, no curvature, ROM normal, no CVA tenderness  Lungs:     Clear to auscultation bilaterally, respirations unlabored  Chest Wall:    No tenderness or deformity   Heart:    Regular rate and rhythm, S1 and S2 normal, no  murmur, rub or gallop  Breast Exam:    No tenderness, masses, or nipple abnormality  Abdomen:     Soft, non-tender, bowel sounds active all four quadrants, no masses, no organomegaly.    Genitalia:    Pelvic:external genitalia normal, vagina without lesions, discharge, or tenderness, rectovaginal septum  normal. Cervix normal in appearance, no cervical motion tenderness, no adnexal masses or tenderness.  Uterus normal size, shape, mobile, regular contours, nontender.  Rectal:    Normal external sphincter.  No hemorrhoids appreciated. Internal exam not done.   Extremities:   Extremities normal, atraumatic, no cyanosis or edema  Pulses:   2+ and symmetric all extremities  Skin:   Skin color, texture, turgor normal, no rashes or lesions  Lymph nodes:   Cervical, supraclavicular, and axillary nodes normal  Neurologic:  CNII-XII intact, normal strength, sensation and reflexes throughout   .  Labs:  Lab Results  Component Value Date   WBC 8.6 02/12/2017   HGB 8.7 (L) 02/12/2017   HCT 26.3 (L) 02/12/2017   MCV 72.9 (L) 02/12/2017   PLT 191 02/12/2017    Lab Results  Component Value Date   CREATININE 0.57 02/12/2017   BUN 8 02/08/2017   NA 137 02/08/2017   K 4.3 02/08/2017   CL 104 02/08/2017   CO2 28 02/08/2017    No results found for: ALT, AST, GGT, ALKPHOS, BILITOT  No results found for: TSH   Assessment:    Healthy female exam.    H/o anemia   Overweight   Plan:    Blood tests: none ordered. Will have labs done with PCP later in the year. Breast self exam technique reviewed and patient encouraged to perform self-exam monthly. Contraception: status post hysterectomy. Discussed healthy lifestyle modifications. Mammogram - up to date.  Pap smears no longer needed, is s/p hysterectomy with no prior h/o cervical dysplasia.   H/o anemia, patient had prior abnormal uterine bleeding with fibroid uterus. Is now s/p hysterectomy. Anemia should have resolved. Will f/u with labs  from PCP.  Patient was perimenopausal around time of hysterectomy. Unsure of when transition to menopause may occur but will continue to look for any signs of change.  Follow up in 1 year.     Rubie Maid, MD [facility]

## 2018-01-01 NOTE — Patient Instructions (Signed)

## 2018-06-01 IMAGING — MG MM DIGITAL SCREENING BILAT W/ CAD
5 series · 5 of 5 positions shown · non-contrast
Comparison: Previous exam(s).

CLINICAL DATA: Screening.

EXAM:
DIGITAL SCREENING BILATERAL MAMMOGRAM WITH CAD

[R CC (1 of 2)]
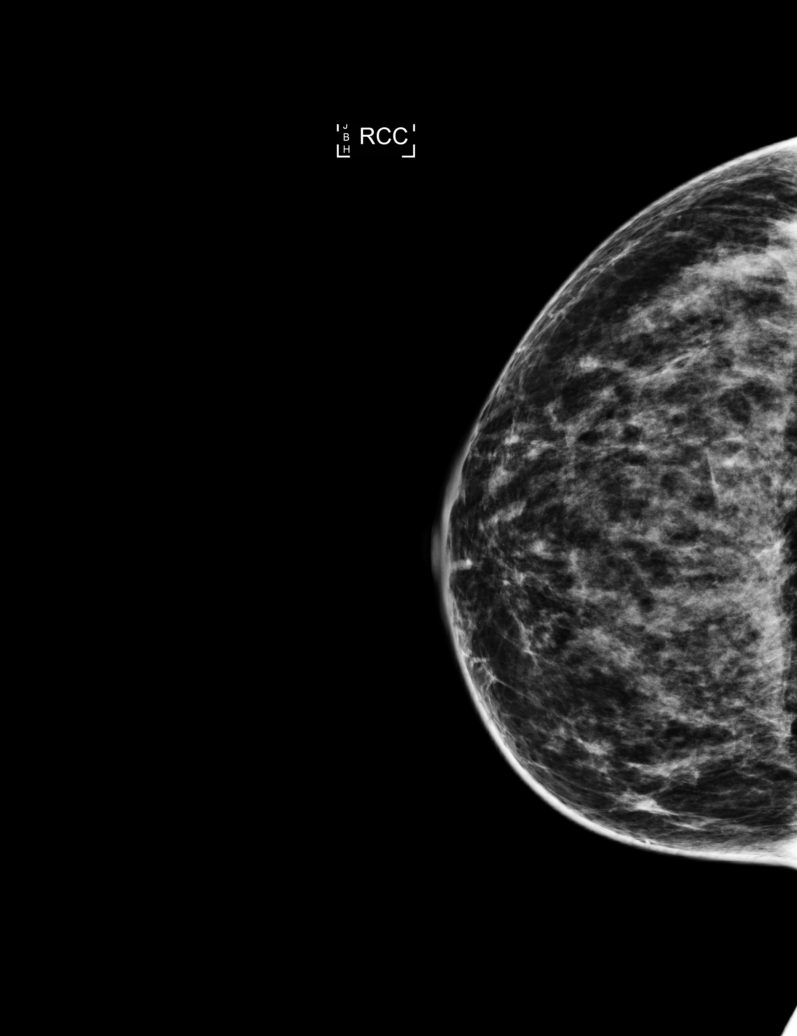

[L CC]
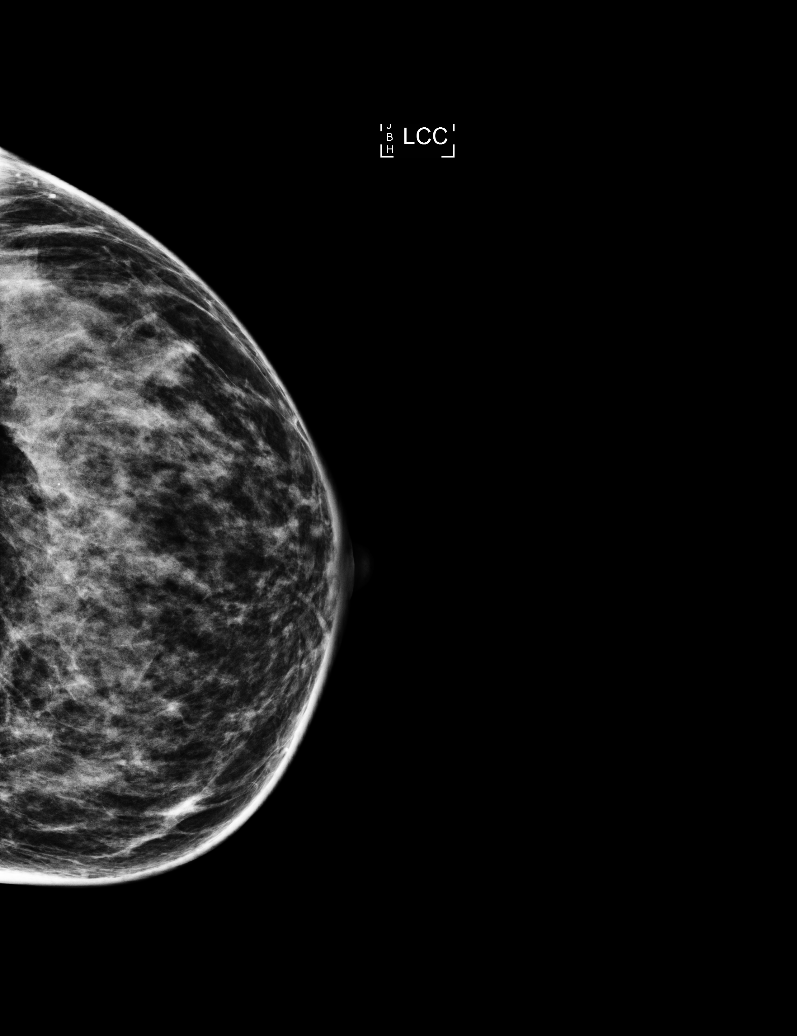

[R CC (2 of 2)]
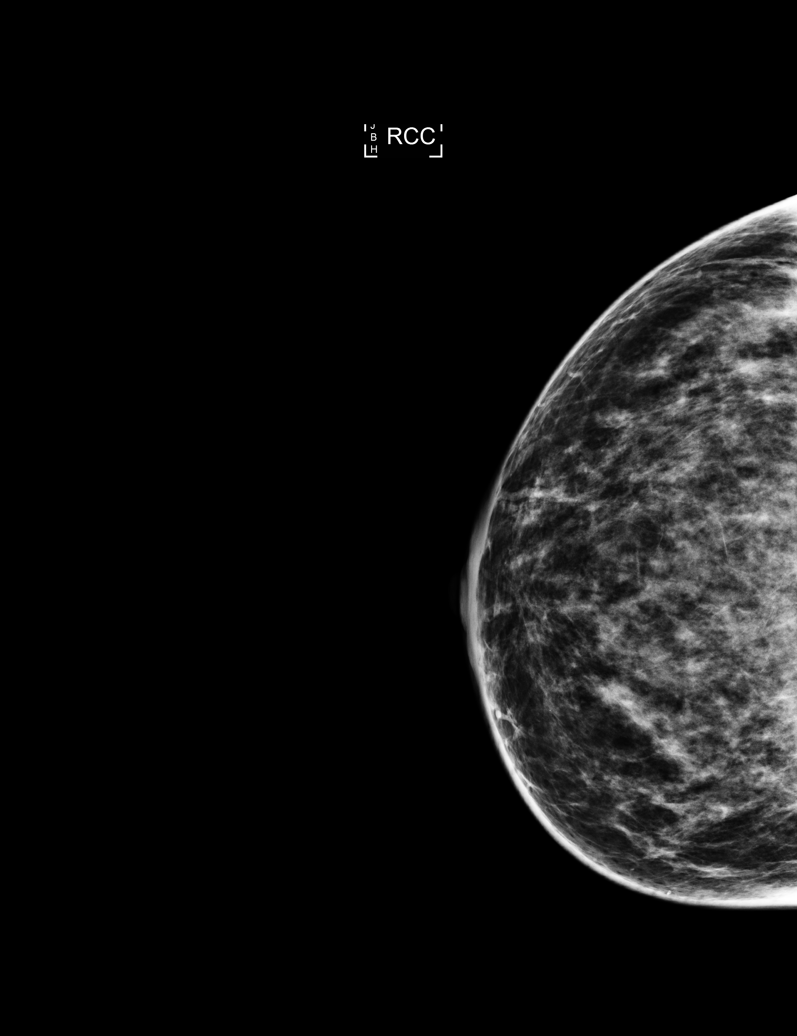

[R MLO]
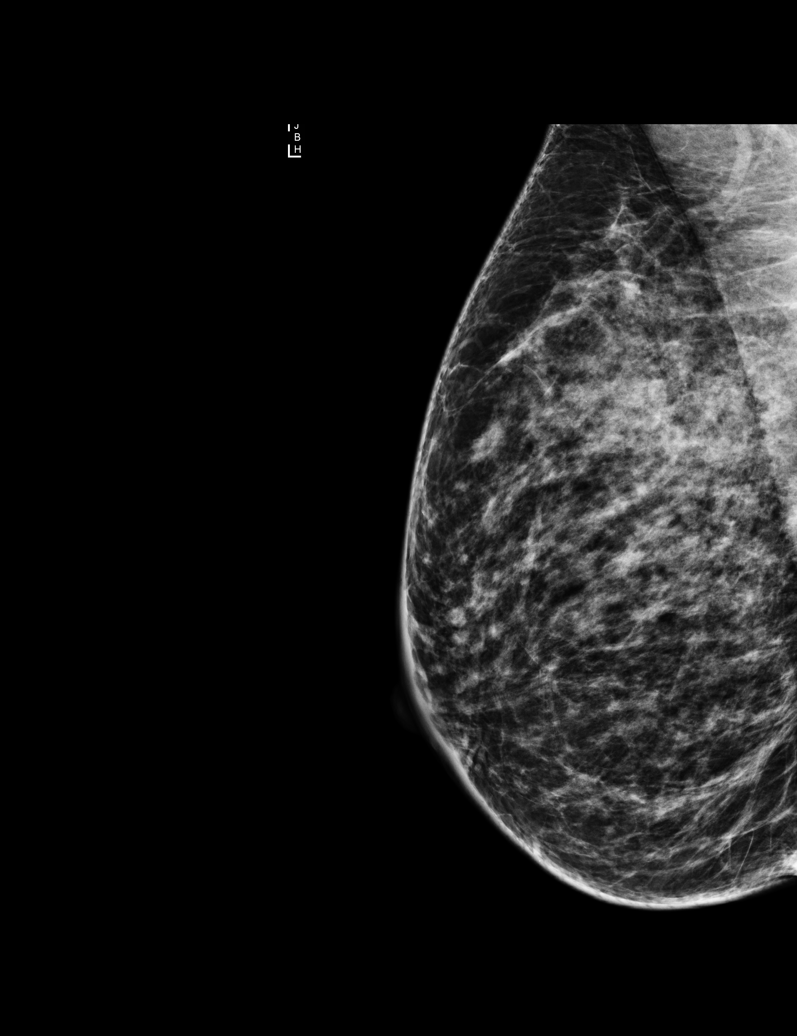

[L MLO]
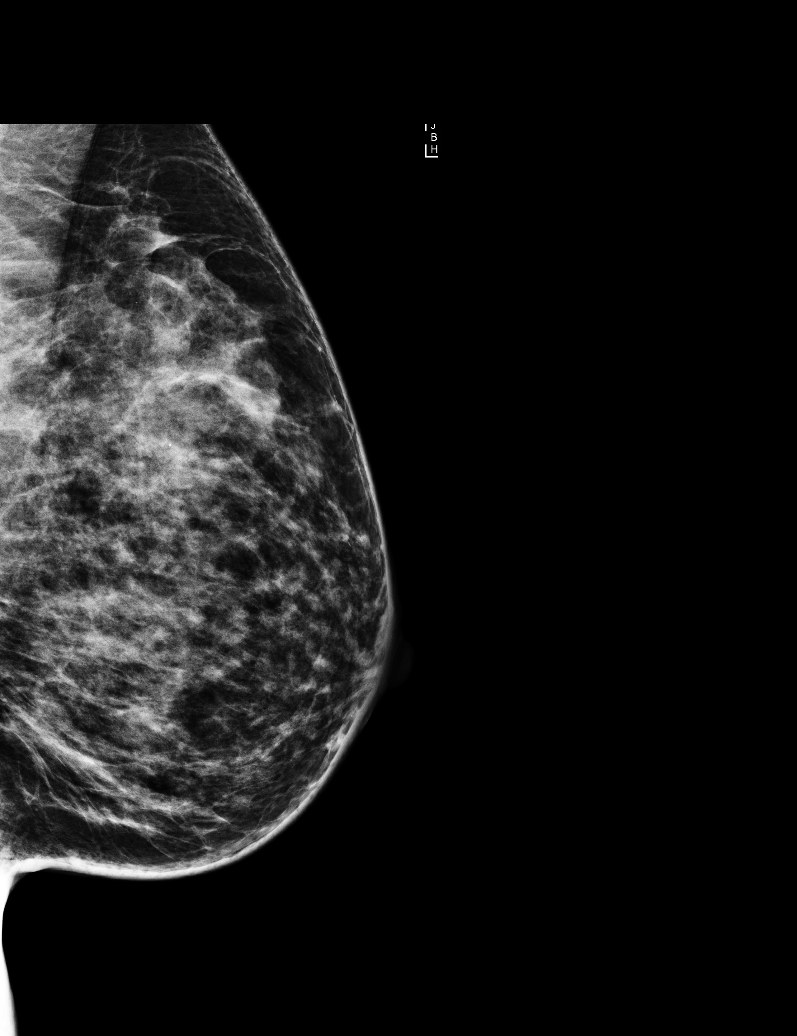

[5 of 5 positions shown; findings below may reference images not displayed]

ACR Breast Density Category c: The breast tissue is heterogeneously
dense, which may obscure small masses.
FINDINGS: There are no findings suspicious for malignancy. Images were
processed with CAD.
IMPRESSION: No mammographic evidence of malignancy. A result letter of this
screening mammogram will be mailed directly to the patient.

RECOMMENDATION:
Screening mammogram in one year. (Code:YJ-[id number])

BI-RADS CATEGORY  1: Negative.

## 2018-06-23 ENCOUNTER — Other Ambulatory Visit: Payer: Self-pay | Admitting: Obstetrics and Gynecology

## 2018-06-23 ENCOUNTER — Ambulatory Visit
Admission: RE | Admit: 2018-06-23 | Discharge: 2018-06-23 | Disposition: A | Discharge status: Home / Self Care | Payer: Federal, State, Local not specified - PPO | Source: Ambulatory Visit | Attending: Obstetrics and Gynecology | Admitting: Obstetrics and Gynecology

## 2018-06-23 DIAGNOSIS — Z1231 Encounter for screening mammogram for malignant neoplasm of breast: Secondary | ICD-10-CM

## 2018-12-31 ENCOUNTER — Encounter: Payer: Federal, State, Local not specified - PPO | Admitting: Obstetrics and Gynecology

## 2019-03-18 ENCOUNTER — Encounter: Payer: Self-pay | Admitting: Obstetrics and Gynecology

## 2019-04-14 ENCOUNTER — Other Ambulatory Visit: Payer: Self-pay | Admitting: Family Medicine

## 2019-04-14 DIAGNOSIS — Z1231 Encounter for screening mammogram for malignant neoplasm of breast: Secondary | ICD-10-CM

## 2019-04-17 ENCOUNTER — Encounter: Payer: Self-pay | Admitting: Obstetrics and Gynecology

## 2019-04-22 ENCOUNTER — Encounter: Payer: Self-pay | Admitting: Obstetrics and Gynecology

## 2019-05-21 ENCOUNTER — Other Ambulatory Visit: Payer: Self-pay

## 2019-05-21 ENCOUNTER — Ambulatory Visit (INDEPENDENT_AMBULATORY_CARE_PROVIDER_SITE_OTHER): Payer: Federal, State, Local not specified - PPO | Admitting: Obstetrics and Gynecology

## 2019-05-21 ENCOUNTER — Encounter: Payer: Self-pay | Admitting: Obstetrics and Gynecology

## 2019-05-21 VITALS — BP 117/76 | HR 67 | Ht 64.0 in | Wt 162.4 lb

## 2019-05-21 DIAGNOSIS — Z1231 Encounter for screening mammogram for malignant neoplasm of breast: Secondary | ICD-10-CM

## 2019-05-21 DIAGNOSIS — E663 Overweight: Secondary | ICD-10-CM | POA: Diagnosis not present

## 2019-05-21 DIAGNOSIS — Z01419 Encounter for gynecological examination (general) (routine) without abnormal findings: Secondary | ICD-10-CM

## 2019-05-21 DIAGNOSIS — N951 Menopausal and female climacteric states: Secondary | ICD-10-CM | POA: Diagnosis not present

## 2019-05-21 NOTE — Progress Notes (Signed)
GYNECOLOGY ANNUAL PHYSICAL EXAM PROGRESS NOTE  Subjective:    Lindsay Ward is a 53 y.o. G59P1011 menopausal female who presents for an annual exam. The patient is sexually active. The patient has no major complaints today. The patient wears seatbelts: yes. The patient participates in regular exercise: yes. Has the patient ever been transfused or tattooed?: no. The patient reports that there is not domestic violence in her life.   Patient notes that she is overall doing well. Just sent her son off to college.  Gynecologic History: Menarche age: 60 or 40 Patient's last menstrual period was 02/08/2017. Contraception: status post hysterectomy in 2018.  History of STI's: Denies Last Pap: 12/2014. Results were: normal.  Denies h/o abnormal pap smears. Last mammogram: 06/2017. Results were: normal Last colonoscopy: 04/2015.  Results were: normal   OB History  Gravida Para Term Preterm AB Living  2 1 1  0 1 1  SAB TAB Ectopic Multiple Live Births  0 1 0 0 1    # Outcome Date GA Lbr Len/2nd Weight Sex Delivery Anes PTL Lv  2 TAB           1 Term      CS-LTranv  N LIV    Past Medical History:  Diagnosis Date  . Anemia   . Fibroid uterus   . Medical history non-contributory     Past Surgical History:  Procedure Laterality Date  . CESAREAN SECTION  2001  . CYSTOSCOPY  02/11/2017   Procedure: CYSTOSCOPY;  Surgeon: Rubie Maid, MD;  Location: [facility];  Service: Gynecology;;  . HYSTERECTOMY ABDOMINAL WITH SALPINGECTOMY N/A 02/11/2017   Procedure: HYSTERECTOMY ABDOMINAL WITH BILATERAL SALPINGECTOMY;  Surgeon: Rubie Maid, MD;  Location: [facility];  Service: Gynecology;  Laterality: N/A;  . TUBAL LIGATION    . UTERINE FIBROID EMBOLIZATION  2011    Family History  Problem Relation Age of Onset  . Hypertension Mother   . Diabetes Mother   . Diabetes Father   . Breast cancer Paternal Grandmother     Social History   Socioeconomic History  . Marital status: Married    Spouse  name: Not on file  . Number of children: Not on file  . Years of education: Not on file  . Highest education level: Not on file  Occupational History  . Not on file  Social Needs  . Financial resource strain: Not on file  . Food insecurity    Worry: Not on file    Inability: Not on file  . Transportation needs    Medical: Not on file    Non-medical: Not on file  Tobacco Use  . Smoking status: Never Smoker  . Smokeless tobacco: Never Used  Substance and Sexual Activity  . Alcohol use: No  . Drug use: No  . Sexual activity: Yes    Birth control/protection: Surgical  Lifestyle  . Physical activity    Days per week: Not on file    Minutes per session: Not on file  . Stress: Not on file  Relationships  . Social Herbalist on phone: Not on file    Gets together: Not on file    Attends religious service: Not on file    Active member of club or organization: Not on file    Attends meetings of clubs or organizations: Not on file    Relationship status: Not on file  . Intimate partner violence    Fear of current or ex partner: Not  on file    Emotionally abused: Not on file    Physically abused: Not on file    Forced sexual activity: Not on file  Other Topics Concern  . Not on file  Social History Narrative  . Not on file    Current Outpatient Medications on File Prior to Visit  Medication Sig Dispense Refill  . Cholecalciferol (VITAMIN D PO) Take 2 tablets by mouth daily.    . ferrous sulfate 325 (65 FE) MG tablet Take 1 tablet (325 mg total) by mouth 2 (two) times daily with a meal. 60 tablet 2  . FIBER PO Take by mouth.    . Multiple Vitamin (MULTI-VITAMINS) TABS Take 1 tablet by mouth daily.      No current facility-administered medications on file prior to visit.     No Known Allergies    Review of Systems Constitutional: negative for chills, fatigue, fevers and sweats. Positive for hot flushes (mild).  Eyes: negative for irritation, redness and  visual disturbance Ears, nose, mouth, throat, and face: negative for hearing loss, nasal congestion, snoring and tinnitus Respiratory: negative for asthma, cough, sputum Cardiovascular: negative for chest pain, dyspnea, exertional chest pressure/discomfort, irregular heart beat, palpitations and syncope Gastrointestinal: negative for abdominal pain, change in bowel habits, nausea and vomiting Genitourinary: negative for abnormal menstrual periods, genital lesions, sexual problems and vaginal discharge, dysuria and urinary incontinence Integument/breast: negative for breast lump, breast tenderness and nipple discharge Hematologic/lymphatic: negative for bleeding and easy bruising Musculoskeletal:negative for back pain and muscle weakness.  Neurological: negative for dizziness, headaches, vertigo and weakness Endocrine: negative for diabetic symptoms including polydipsia, polyuria and skin dryness Allergic/Immunologic: negative for hay fever and urticaria      Objective:  Blood pressure 117/76, pulse 67, height 5\' 4"  (1.626 m), weight 162 lb 6.4 oz (73.7 kg), last menstrual period 02/08/2017. Body mass index is 27.88 kg/m.  General Appearance:    Alert, cooperative, no distress, appears stated age, overweight  Head:    Normocephalic, without obvious abnormality, atraumatic  Eyes:    PERRL, conjunctiva/corneas clear, EOM's intact, both eyes  Ears:    Normal external ear canals, both ears  Nose:   Nares normal, septum midline, mucosa normal, no drainage or sinus tenderness  Throat:   Lips, mucosa, and tongue normal; teeth and gums normal  Neck:   Supple, symmetrical, trachea midline, no adenopathy; thyroid: no enlargement/tenderness/nodules; no carotid bruit or JVD  Back:     Symmetric, no curvature, ROM normal, no CVA tenderness  Lungs:     Clear to auscultation bilaterally, respirations unlabored  Chest Wall:    No tenderness or deformity   Heart:    Regular rate and rhythm, S1 and S2  normal, no murmur, rub or gallop  Breast Exam:    No tenderness, masses, or nipple abnormality  Abdomen:     Soft, non-tender, bowel sounds active all four quadrants, no masses, no organomegaly.    Genitalia:    Pelvic:external genitalia normal, vagina without lesions, discharge, or tenderness, rectovaginal septum  normal. Cervix normal in appearance, no cervical motion tenderness, no adnexal masses or tenderness.  Uterus normal size, shape, mobile, regular contours, nontender.  Rectal:    Normal external sphincter.  No hemorrhoids appreciated. Internal exam not done.   Extremities:   Extremities normal, atraumatic, no cyanosis or edema  Pulses:   2+ and symmetric all extremities  Skin:   Skin color, texture, turgor normal, no rashes or lesions  Lymph nodes:   Cervical,  supraclavicular, and axillary nodes normal  Neurologic:   CNII-XII intact, normal strength, sensation and reflexes throughout   .  Labs:  Labs reviewed in Care Everywhere, performed 06/27/2018   Assessment:   Healthy female exam.  Overweight  Menopausal vasomotor symptoms  Plan:    Blood tests: none ordered. Will have labs done with PCP next month.  Breast self exam technique reviewed and patient encouraged to perform self-exam monthly. Contraception: status post hysterectomy. Discussed healthy lifestyle modifications. Mammogram - up to date.  Pap smears no longer needed, is s/p hysterectomy with no prior h/o cervical dysplasia.   Patient likely menopausal, mild symptoms. Discussed lifestyle interventions such as wearing light clothing, remaining in cool environments, having fan/air conditioner in the room, avoiding hot beverages etc.  Patient declines medications at this time.   Follow up in 1 year.     Rubie Maid, MD [facility]

## 2019-05-21 NOTE — Progress Notes (Signed)
Pt is present for annual exam. Pt stated that she is doing well no problems.  

## 2019-05-21 NOTE — Patient Instructions (Addendum)
Health Maintenance for Postmenopausal Women Menopause is a normal process in which your ability to get pregnant comes to an end. This process happens slowly over many months or years, usually between the ages of 48 and 55. Menopause is complete when you have missed your menstrual periods for 12 months. It is important to talk with your health care provider about some of the most common conditions that affect women after menopause (postmenopausal women). These include heart disease, cancer, and bone loss (osteoporosis). Adopting a healthy lifestyle and getting preventive care can help to promote your health and wellness. The actions you take can also lower your chances of developing some of these common conditions. What should I know about menopause? During menopause, you may get a number of symptoms, such as:  Hot flashes. These can be moderate or severe.  Night sweats.  Decrease in sex drive.  Mood swings.  Headaches.  Tiredness.  Irritability.  Memory problems.  Insomnia. Choosing to treat or not to treat these symptoms is a decision that you make with your health care provider. Do I need hormone replacement therapy?  Hormone replacement therapy is effective in treating symptoms that are caused by menopause, such as hot flashes and night sweats.  Hormone replacement carries certain risks, especially as you become older. If you are thinking about using estrogen or estrogen with progestin, discuss the benefits and risks with your health care provider. What is my risk for heart disease and stroke? The risk of heart disease, heart attack, and stroke increases as you age. One of the causes may be a change in the body's hormones during menopause. This can affect how your body uses dietary fats, triglycerides, and cholesterol. Heart attack and stroke are medical emergencies. There are many things that you can do to help prevent heart disease and stroke. Watch your blood pressure  High  blood pressure causes heart disease and increases the risk of stroke. This is more likely to develop in people who have high blood pressure readings, are of African descent, or are overweight.  Have your blood pressure checked: ? Every 3-5 years if you are 18-39 years of age. ? Every year if you are 40 years old or older. Eat a healthy diet   Eat a diet that includes plenty of vegetables, fruits, low-fat dairy products, and lean protein.  Do not eat a lot of foods that are high in solid fats, added sugars, or sodium. Get regular exercise Get regular exercise. This is one of the most important things you can do for your health. Most adults should:  Try to exercise for at least 150 minutes each week. The exercise should increase your heart rate and make you sweat (moderate-intensity exercise).  Try to do strengthening exercises at least twice each week. Do these in addition to the moderate-intensity exercise.  Spend less time sitting. Even light physical activity can be beneficial. Other tips  Work with your health care provider to achieve or maintain a healthy weight.  Do not use any products that contain nicotine or tobacco, such as cigarettes, e-cigarettes, and chewing tobacco. If you need help quitting, ask your health care provider.  Know your numbers. Ask your health care provider to check your cholesterol and your blood sugar (glucose). Continue to have your blood tested as directed by your health care provider. Do I need screening for cancer? Depending on your health history and family history, you may need to have cancer screening at different stages of your life. This   may include screening for:  Breast cancer.  Cervical cancer.  Lung cancer.  Colorectal cancer. What is my risk for osteoporosis? After menopause, you may be at increased risk for osteoporosis. Osteoporosis is a condition in which bone destruction happens more quickly than new bone creation. To help prevent  osteoporosis or the bone fractures that can happen because of osteoporosis, you may take the following actions:  If you are 19-50 years old, get at least 1,000 mg of calcium and at least 600 mg of vitamin D per day.  If you are older than age 50 but younger than age 70, get at least 1,200 mg of calcium and at least 600 mg of vitamin D per day.  If you are older than age 70, get at least 1,200 mg of calcium and at least 800 mg of vitamin D per day. Smoking and drinking excessive alcohol increase the risk of osteoporosis. Eat foods that are rich in calcium and vitamin D, and do weight-bearing exercises several times each week as directed by your health care provider. How does menopause affect my mental health? Depression may occur at any age, but it is more common as you become older. Common symptoms of depression include:  Low or sad mood.  Changes in sleep patterns.  Changes in appetite or eating patterns.  Feeling an overall lack of motivation or enjoyment of activities that you previously enjoyed.  Frequent crying spells. Talk with your health care provider if you think that you are experiencing depression. General instructions See your health care provider for regular wellness exams and vaccines. This may include:  Scheduling regular health, dental, and eye exams.  Getting and maintaining your vaccines. These include: ? Influenza vaccine. Get this vaccine each year before the flu season begins. ? Pneumonia vaccine. ? Shingles vaccine. ? Tetanus, diphtheria, and pertussis (Tdap) booster vaccine. Your health care provider may also recommend other immunizations. Tell your health care provider if you have ever been abused or do not feel safe at home. Summary  Menopause is a normal process in which your ability to get pregnant comes to an end.  This condition causes hot flashes, night sweats, decreased interest in sex, mood swings, headaches, or lack of sleep.  Treatment for this  condition may include hormone replacement therapy.  Take actions to keep yourself healthy, including exercising regularly, eating a healthy diet, watching your weight, and checking your blood pressure and blood sugar levels.  Get screened for cancer and depression. Make sure that you are up to date with all your vaccines. This information is not intended to replace advice given to you by your health care provider. Make sure you discuss any questions you have with your health care provider. Document Released: 10/19/2005 Document Revised: 08/20/2018 Document Reviewed: 08/20/2018 Elsevier Patient Education  2020 [employer]   Breast Self-Awareness Breast self-awareness means being familiar with how your breasts look and feel. It involves checking your breasts regularly and reporting any changes to your health care provider. Practicing breast self-awareness is important. Sometimes changes may not be harmful (are benign), but sometimes a change in your breasts can be a sign of a serious medical problem. It is important to learn how to do this procedure correctly so that you can catch problems early, when treatment is more likely to be successful. All women should practice breast self-awareness, including women who have had breast implants. What you need:  A mirror.  A well-lit room. How to do a breast self-exam A breast   self-exam is one way to learn what is normal for your breasts and whether your breasts are changing. To do a breast self-exam: Look for changes  1. Remove all the clothing above your waist. 2. Stand in front of a mirror in a room with good lighting. 3. Put your hands on your hips. 4. Push your hands firmly downward. 5. Compare your breasts in the mirror. Look for differences between them (asymmetry), such as: ? Differences in shape. ? Differences in size. ? Puckers, dips, and bumps in one breast and not the other. 6. Look at each breast for changes in the skin, such as: ?  Redness. ? Scaly areas. 7. Look for changes in your nipples, such as: ? Discharge. ? Bleeding. ? Dimpling. ? Redness. ? A change in position. Feel for changes Carefully feel your breasts for lumps and changes. It is best to do this while lying on your back on the floor, and again while sitting or standing in the tub or shower with soapy water on your skin. Feel each breast in the following way: 1. Place the arm on the side of the breast you are examining above your head. 2. Feel your breast with the other hand. 3. Start in the nipple area and make -inch (2 cm) overlapping circles to feel your breast. Use the pads of your three middle fingers to do this. Apply light pressure, then medium pressure, then firm pressure. The light pressure will allow you to feel the tissue closest to the skin. The medium pressure will allow you to feel the tissue that is a little deeper. The firm pressure will allow you to feel the tissue close to the ribs. 4. Continue the overlapping circles, moving downward over the breast until you feel your ribs below your breast. 5. Move one finger-width toward the center of the body. Continue to use the -inch (2 cm) overlapping circles to feel your breast as you move slowly up toward your collarbone. 6. Continue the up-and-down exam using all three pressures until you reach your armpit.  Write down what you find Writing down what you find can help you remember what to discuss with your health care provider. Write down:  What is normal for each breast.  Any changes that you find in each breast, including: ? The kind of changes you find. ? Any pain or tenderness. ? Size and location of any lumps.  Where you are in your menstrual cycle, if you are still menstruating. General tips and recommendations  Examine your breasts every month.  If you are breastfeeding, the best time to examine your breasts is after a feeding or after using a breast pump.  If you menstruate,  the best time to examine your breasts is 5-7 days after your period. Breasts are generally lumpier during menstrual periods, and it may be more difficult to notice changes.  With time and practice, you will become more familiar with the variations in your breasts and more comfortable with the exam. Contact a health care provider if you:  See a change in the shape or size of your breasts or nipples.  See a change in the skin of your breast or nipples, such as a reddened or scaly area.  Have unusual discharge from your nipples.  Find a lump or thick area that was not there before.  Have pain in your breasts.  Have any concerns related to your breast health. Summary  Breast self-awareness includes looking for physical changes in your breasts,   as well as feeling for any changes within your breasts.  Breast self-awareness should be performed in front of a mirror in a well-lit room.  You should examine your breasts every month. If you menstruate, the best time to examine your breasts is 5-7 days after your menstrual period.  Let your health care provider know of any changes you notice in your breasts, including changes in size, changes on the skin, pain or tenderness, or unusual fluid from your nipples. This information is not intended to replace advice given to you by your health care provider. Make sure you discuss any questions you have with your health care provider. Document Released: 08/27/2005 Document Revised: 04/15/2018 Document Reviewed: 04/15/2018 Elsevier Patient Education  2020 Elsevier Inc.   

## 2019-06-25 ENCOUNTER — Ambulatory Visit
Admission: RE | Admit: 2019-06-25 | Discharge: 2019-06-25 | Disposition: A | Discharge status: Home / Self Care | Payer: Federal, State, Local not specified - PPO | Source: Ambulatory Visit | Attending: Family Medicine | Admitting: Family Medicine

## 2019-06-25 DIAGNOSIS — Z1231 Encounter for screening mammogram for malignant neoplasm of breast: Secondary | ICD-10-CM | POA: Diagnosis present

## 2020-04-01 ENCOUNTER — Other Ambulatory Visit: Payer: Self-pay | Admitting: Obstetrics and Gynecology

## 2020-04-01 DIAGNOSIS — Z1231 Encounter for screening mammogram for malignant neoplasm of breast: Secondary | ICD-10-CM

## 2020-05-26 ENCOUNTER — Ambulatory Visit (INDEPENDENT_AMBULATORY_CARE_PROVIDER_SITE_OTHER): Payer: Federal, State, Local not specified - PPO | Admitting: Obstetrics and Gynecology

## 2020-05-26 ENCOUNTER — Encounter: Payer: Self-pay | Admitting: Obstetrics and Gynecology

## 2020-05-26 ENCOUNTER — Other Ambulatory Visit: Payer: Self-pay

## 2020-05-26 VITALS — BP 125/83 | HR 67 | Ht 64.0 in | Wt 157.8 lb

## 2020-05-26 DIAGNOSIS — Z23 Encounter for immunization: Secondary | ICD-10-CM

## 2020-05-26 DIAGNOSIS — E663 Overweight: Secondary | ICD-10-CM | POA: Diagnosis not present

## 2020-05-26 DIAGNOSIS — N951 Menopausal and female climacteric states: Secondary | ICD-10-CM | POA: Diagnosis not present

## 2020-05-26 DIAGNOSIS — Z01419 Encounter for gynecological examination (general) (routine) without abnormal findings: Secondary | ICD-10-CM | POA: Diagnosis not present

## 2020-05-26 NOTE — Progress Notes (Signed)
GYNECOLOGY ANNUAL PHYSICAL EXAM PROGRESS NOTE  Subjective:    Lindsay Ward is a 54 y.o. G39P1011 menopausal female who presents for an annual exam. The patient is sexually active. The patient has no major complaints today. The patient wears seatbelts: yes. The patient participates in regular exercise: yes. Has the patient ever been transfused or tattooed?: no.   Patient notes that she is overall doing well. Notes she and her husband have both retired this past year.   Gynecologic History: Menarche age: 71 or 35 Patient's last menstrual period was 02/08/2017.  Is s/p hysterectomy.  Contraception: status post hysterectomy in 2018.  History of STI's: Denies Last Pap: 12/2014. Results were: normal.  No prior history of abnormal pap smears. Last mammogram: 06/2017. Results were: normal Last colonoscopy: 04/2015.  Results were: normal   OB History  Gravida Para Term Preterm AB Living  2 1 1  0 1 1  SAB TAB Ectopic Multiple Live Births  0 1 0 0 1    # Outcome Date GA Lbr Len/2nd Weight Sex Delivery Anes PTL Lv  2 TAB           1 Term      CS-LTranv  N LIV    Past Medical History:  Diagnosis Date  . Anemia   . Fibroid uterus     Past Surgical History:  Procedure Laterality Date  . CESAREAN SECTION  2001  . CYSTOSCOPY  02/11/2017   Procedure: CYSTOSCOPY;  Surgeon: Rubie Maid, MD;  Location: [facility] OR[facility];  Service: Gynecology;;  . HYSTERECTOMY ABDOMINAL WITH SALPINGECTOMY N/A 02/11/2017   Procedure: HYSTERECTOMY ABDOMINAL WITH BILATERAL SALPINGECTOMY;  Surgeon: Rubie Maid, MD;  Location: [facility] OR[facility];  Service: Gynecology;  Laterality: N/A;  . TUBAL LIGATION    . UTERINE FIBROID EMBOLIZATION  2011    Family History  Problem Relation Age of Onset  . Hypertension Mother   . Diabetes Mother   . Diabetes Father   . Breast cancer Paternal Grandmother     Social History   Socioeconomic History  . Marital status: Married    Spouse name: Not on file  . Number of children: Not on  file  . Years of education: Not on file  . Highest education level: Not on file  Occupational History  . Not on file  Tobacco Use  . Smoking status: Never Smoker  . Smokeless tobacco: Never Used  Vaping Use  . Vaping Use: Never used  Substance and Sexual Activity  . Alcohol use: No  . Drug use: No  . Sexual activity: Yes    Birth control/protection: Surgical  Other Topics Concern  . Not on file  Social History Narrative  . Not on file   Social Determinants of Health   Financial Resource Strain:   . Difficulty of Paying Living Expenses: Not on file  Food Insecurity:   . Worried About Charity fundraiser in the Last Year: Not on file  . Ran Out of Food in the Last Year: Not on file  Transportation Needs:   . Lack of Transportation (Medical): Not on file  . Lack of Transportation (Non-Medical): Not on file  Physical Activity:   . Days of Exercise per Week: Not on file  . Minutes of Exercise per Session: Not on file  Stress:   . Feeling of Stress : Not on file  Social Connections:   . Frequency of Communication with Friends and Family: Not on file  . Frequency of Social Gatherings with  Friends and Family: Not on file  . Attends Religious Services: Not on file  . Active Member of Clubs or Organizations: Not on file  . Attends Archivist Meetings: Not on file  . Marital Status: Not on file  Intimate Partner Violence:   . Fear of Current or Ex-Partner: Not on file  . Emotionally Abused: Not on file  . Physically Abused: Not on file  . Sexually Abused: Not on file    Current Outpatient Medications on File Prior to Visit  Medication Sig Dispense Refill  . Cholecalciferol (VITAMIN D PO) Take 2 tablets by mouth daily.     No current facility-administered medications on file prior to visit.    No Known Allergies    Review of Systems Constitutional: negative for chills, fatigue, fevers and sweats. Positive for hot flushes (mild).  Eyes: negative for  irritation, redness and visual disturbance Ears, nose, mouth, throat, and face: negative for hearing loss, nasal congestion, snoring and tinnitus Respiratory: negative for asthma, cough, sputum Cardiovascular: negative for chest pain, dyspnea, exertional chest pressure/discomfort, irregular heart beat, palpitations and syncope Gastrointestinal: negative for abdominal pain, change in bowel habits, nausea and vomiting Genitourinary: negative for abnormal menstrual periods, genital lesions, sexual problems and vaginal discharge, dysuria and urinary incontinence Integument/breast: negative for breast lump, breast tenderness and nipple discharge Hematologic/lymphatic: negative for bleeding and easy bruising Musculoskeletal:negative for back pain and muscle weakness.  Neurological: negative for dizziness, headaches, vertigo and weakness Endocrine: negative for diabetic symptoms including polydipsia, polyuria and skin dryness Allergic/Immunologic: negative for hay fever and urticaria      Objective:  Blood pressure 125/83, pulse 67, height 5\' 4"  (1.626 m), weight 157 lb 12.8 oz (71.6 kg), last menstrual period 02/08/2017. Body mass index is 27.09 kg/m.  General Appearance:    Alert, cooperative, no distress, appears stated age, overweight  Head:    Normocephalic, without obvious abnormality, atraumatic  Eyes:    PERRL, conjunctiva/corneas clear, EOM's intact, both eyes  Ears:    Normal external ear canals, both ears  Nose:   Nares normal, septum midline, mucosa normal, no drainage or sinus tenderness  Throat:   Lips, mucosa, and tongue normal; teeth and gums normal  Neck:   Supple, symmetrical, trachea midline, no adenopathy; thyroid: no enlargement/tenderness/nodules; no carotid bruit or JVD  Back:     Symmetric, no curvature, ROM normal, no CVA tenderness  Lungs:     Clear to auscultation bilaterally, respirations unlabored  Chest Wall:    No tenderness or deformity   Heart:    Regular rate and  rhythm, S1 and S2 normal, no murmur, rub or gallop  Breast Exam:    No tenderness, masses, or nipple abnormality  Abdomen:     Soft, non-tender, bowel sounds active all four quadrants, no masses, no organomegaly.    Genitalia:    Pelvic:external genitalia normal, vagina without lesions, discharge, or tenderness, rectovaginal septum  normal. Cervix normal in appearance, no cervical motion tenderness, no adnexal masses or tenderness.  Uterus normal size, shape, mobile, regular contours, nontender.  Rectal:    Normal external sphincter.  No hemorrhoids appreciated. Internal exam not done.   Extremities:   Extremities normal, atraumatic, no cyanosis or edema  Pulses:   2+ and symmetric all extremities  Skin:   Skin color, texture, turgor normal, no rashes or lesions  Lymph nodes:   Cervical, supraclavicular, and axillary nodes normal  Neurologic:   CNII-XII intact, normal strength, sensation and reflexes throughout   .  Labs:  Labs reviewed in Care Everywhere, performed 06/27/2018   Assessment:   Healthy female exam.  Overweight  Menopausal vasomotor symptoms  Plan:    - Blood tests: none ordered. Has labs done by PCP.  - Breast self exam technique reviewed and patient encouraged to perform self-exam monthly. - Contraception: status post hysterectomy. - Discussed healthy lifestyle modifications. - Mammogram - up to date. Due next month.  - Pap smears no longer needed, is s/p hysterectomy with no prior h/o cervical dysplasia. - Patient likely menopausal, mild symptoms, intermittent. Continues lifestyle modifications. Patient declines medications at this time.   - Flu vaccine given today.  - COVID vaccination series completed.  - Follow up in 1 year.     Rubie Maid, MD [facility]

## 2020-05-26 NOTE — Progress Notes (Signed)
Pt present for annual exam. Pt stated that she was doing well. Flu vaccine administered.

## 2020-05-26 NOTE — Patient Instructions (Addendum)
Influenza (Flu) Vaccine (Inactivated or Recombinant): What You Need to Know 1. Why get vaccinated? Influenza vaccine can prevent influenza (flu). Flu is a contagious disease that spreads around the [place] every year, usually between October and May. Anyone can get the flu, but it is more dangerous for some people. Infants and young children, people 56 years of age and older, pregnant women, and people with certain health conditions or a weakened immune system are at greatest risk of flu complications. Pneumonia, bronchitis, sinus infections and ear infections are examples of flu-related complications. If you have a medical condition, such as heart disease, cancer or diabetes, flu can make it worse. Flu can cause fever and chills, sore throat, muscle aches, fatigue, cough, headache, and runny or stuffy nose. Some people may have vomiting and diarrhea, though this is more common in children than adults. Each year thousands of people in the [place] die from flu, and many more are hospitalized. Flu vaccine prevents millions of illnesses and flu-related visits to the doctor each year. 2. Influenza vaccine CDC recommends everyone 53 months of age and older get vaccinated every flu season. Children 6 months through 91 years of age may need 2 doses during a single flu season. Everyone else needs only 1 dose each flu season. It takes about 2 weeks for protection to develop after vaccination. There are many flu viruses, and they are always changing. Each year a new flu vaccine is made to protect against three or four viruses that are likely to cause disease in the upcoming flu season. Even when the vaccine doesn't exactly match these viruses, it may still provide some protection. Influenza vaccine does not cause flu. Influenza vaccine may be given at the same time as other vaccines. 3. Talk with your health care provider Tell your vaccine provider if the person getting the vaccine:  Has had an  allergic reaction after a previous dose of influenza vaccine, or has any severe, life-threatening allergies.  Has ever had Guillain-Barr Syndrome (also called GBS). In some cases, your health care provider may decide to postpone influenza vaccination to a future visit. People with minor illnesses, such as a cold, may be vaccinated. People who are moderately or severely ill should usually wait until they recover before getting influenza vaccine. Your health care provider can give you more information. 4. Risks of a vaccine reaction  Soreness, redness, and swelling where shot is given, fever, muscle aches, and headache can happen after influenza vaccine.  There may be a very small increased risk of Guillain-Barr Syndrome (GBS) after inactivated influenza vaccine (the flu shot). Young children who get the flu shot along with pneumococcal vaccine (PCV13), and/or DTaP vaccine at the same time might be slightly more likely to have a seizure caused by fever. Tell your health care provider if a child who is getting flu vaccine has ever had a seizure. People sometimes faint after medical procedures, including vaccination. Tell your provider if you feel dizzy or have vision changes or ringing in the ears. As with any medicine, there is a very remote chance of a vaccine causing a severe allergic reaction, other serious injury, or death. 5. What if there is a serious problem? An allergic reaction could occur after the vaccinated person leaves the clinic. If you see signs of a severe allergic reaction (hives, swelling of the face and throat, difficulty breathing, a fast heartbeat, dizziness, or weakness), call 9-1-1 and get the person to the nearest hospital. For other signs that  concern you, call your health care provider. Adverse reactions should be reported to the Vaccine Adverse Event Reporting System (VAERS). Your health care provider will usually file this report, or you can do it yourself. Visit the  VAERS website at www.vaers.SamedayNews.es or call 947-021-7269.VAERS is only for reporting reactions, and VAERS staff do not give medical advice. 6. The National Vaccine Injury Compensation Program The [employer] Vaccine Injury Compensation Program (VICP) is a federal program that was created to compensate people who may have been injured by certain vaccines. Visit the VICP website at GoldCloset.com.ee or call (920)127-1876 to learn about the program and about filing a claim. There is a time limit to file a claim for compensation. 7. How can I learn more?  Ask your healthcare provider.  Call your local or state health department.  Contact the Centers for Disease Control and Prevention (CDC): ? Call 908-615-9383 (1-800-CDC-INFO) or ? Visit CDC's https://gibson.com/ Vaccine Information Statement (Interim) Inactivated Influenza Vaccine (04/24/2018) This information is not intended to replace advice given to you by your health care provider. Make sure you discuss any questions you have with your health care provider. Document Revised: 12/16/2018 Document Reviewed: 04/28/2018 Elsevier Patient Education  2020 Elsevier Inc.   Preventive Care 25-72 Years Old, Female Preventive care refers to visits with your health care provider and lifestyle choices that can promote health and wellness. This includes:  A yearly physical exam. This may also be called an annual well check.  Regular dental visits and eye exams.  Immunizations.  Screening for certain conditions.  Healthy lifestyle choices, such as eating a healthy diet, getting regular exercise, not using drugs or products that contain nicotine and tobacco, and limiting alcohol use. What can I expect for my preventive care visit? Physical exam Your health care provider will check your:  Height and weight. This may be used to calculate body mass index (BMI), which tells if you are at a healthy weight.  Heart rate and blood  pressure.  Skin for abnormal spots. Counseling Your health care provider may ask you questions about your:  Alcohol, tobacco, and drug use.  Emotional well-being.  Home and relationship well-being.  Sexual activity.  Eating habits.  Work and work Statistician.  Method of birth control.  Menstrual cycle.  Pregnancy history. What immunizations do I need?  Influenza (flu) vaccine  This is recommended every year. Tetanus, diphtheria, and pertussis (Tdap) vaccine  You may need a Td booster every 10 years. Varicella (chickenpox) vaccine  You may need this if you have not been vaccinated. Zoster (shingles) vaccine  You may need this after age 57. Measles, mumps, and rubella (MMR) vaccine  You may need at least one dose of MMR if you were born in 1957 or later. You may also need a second dose. Pneumococcal conjugate (PCV13) vaccine  You may need this if you have certain conditions and were not previously vaccinated. Pneumococcal polysaccharide (PPSV23) vaccine  You may need one or two doses if you smoke cigarettes or if you have certain conditions. Meningococcal conjugate (MenACWY) vaccine  You may need this if you have certain conditions. Hepatitis A vaccine  You may need this if you have certain conditions or if you travel or work in places where you may be exposed to hepatitis A. Hepatitis B vaccine  You may need this if you have certain conditions or if you travel or work in places where you may be exposed to hepatitis B. Haemophilus influenzae type b (Hib) vaccine  You  may need this if you have certain conditions. Human papillomavirus (HPV) vaccine  If recommended by your health care provider, you may need three doses over 6 months. You may receive vaccines as individual doses or as more than one vaccine together in one shot (combination vaccines). Talk with your health care provider about the risks and benefits of combination vaccines. What tests do I  need? Blood tests  Lipid and cholesterol levels. These may be checked every 5 years, or more frequently if you are over 85 years old.  Hepatitis C test.  Hepatitis B test. Screening  Lung cancer screening. You may have this screening every year starting at age 77 if you have a 30-pack-year history of smoking and currently smoke or have quit within the past 15 years.  Colorectal cancer screening. All adults should have this screening starting at age 77 and continuing until age 69. Your health care provider may recommend screening at age 10 if you are at increased risk. You will have tests every 1-10 years, depending on your results and the type of screening test.  Diabetes screening. This is done by checking your blood sugar (glucose) after you have not eaten for a while (fasting). You may have this done every 1-3 years.  Mammogram. This may be done every 1-2 years. Talk with your health care provider about when you should start having regular mammograms. This may depend on whether you have a family history of breast cancer.  BRCA-related cancer screening. This may be done if you have a family history of breast, ovarian, tubal, or peritoneal cancers.  Pelvic exam and Pap test. This may be done every 3 years starting at age 67. Starting at age 59, this may be done every 5 years if you have a Pap test in combination with an HPV test. Other tests  Sexually transmitted disease (STD) testing.  Bone density scan. This is done to screen for osteoporosis. You may have this scan if you are at high risk for osteoporosis. Follow these instructions at home: Eating and drinking  Eat a diet that includes fresh fruits and vegetables, whole grains, lean protein, and low-fat dairy.  Take vitamin and mineral supplements as recommended by your health care provider.  Do not drink alcohol if: ? Your health care provider tells you not to drink. ? You are pregnant, may be pregnant, or are planning to  become pregnant.  If you drink alcohol: ? Limit how much you have to 0-1 drink a day. ? Be aware of how much alcohol is in your drink. In the [place] one drink equals one 12 oz bottle of beer (355 mL), one 5 oz glass of wine (148 mL), or one 1 oz glass of hard liquor (44 mL). Lifestyle  Take daily care of your teeth and gums.  Stay active. Exercise for at least 30 minutes on 5 or more days each week.  Do not use any products that contain nicotine or tobacco, such as cigarettes, e-cigarettes, and chewing tobacco. If you need help quitting, ask your health care provider.  If you are sexually active, practice safe sex. Use a condom or other form of birth control (contraception) in order to prevent pregnancy and STIs (sexually transmitted infections).  If told by your health care provider, take low-dose aspirin daily starting at age 68. What's next?  Visit your health care provider once a year for a well check visit.  Ask your health care provider how often you should have your eyes and  teeth checked.  Stay up to date on all vaccines. This information is not intended to replace advice given to you by your health care provider. Make sure you discuss any questions you have with your health care provider. Document Revised: 05/08/2018 Document Reviewed: 05/08/2018 Elsevier Patient Education  2020 [place] Breast self-awareness is knowing how your breasts look and feel. Doing breast self-awareness is important. It allows you to catch a breast problem early while it is still small and can be treated. All women should do breast self-awareness, including women who have had breast implants. Tell your doctor if you notice a change in your breasts. What you need:  A mirror.  A well-lit room. How to do a breast self-exam A breast self-exam is one way to learn what is normal for your breasts and to check for changes. To do a breast self-exam: Look for changes  1. Take  off all the clothes above your waist. 2. Stand in front of a mirror in a room with good lighting. 3. Put your hands on your hips. 4. Push your hands down. 5. Look at your breasts and nipples in the mirror to see if one breast or nipple looks different from the other. Check to see if: ? The shape of one breast is different. ? The size of one breast is different. ? There are wrinkles, dips, and bumps in one breast and not the other. 6. Look at each breast for changes in the skin, such as: ? Redness. ? Scaly areas. 7. Look for changes in your nipples, such as: ? Liquid around the nipples. ? Bleeding. ? Dimpling. ? Redness. ? A change in where the nipples are. Feel for changes  1. Lie on your back on the floor. 2. Feel each breast. To do this, follow these steps: ? Pick a breast to feel. ? Put the arm closest to that breast above your head. ? Use your other arm to feel the nipple area of your breast. Feel the area with the pads of your three middle fingers by making small circles with your fingers. For the first circle, press lightly. For the second circle, press harder. For the third circle, press even harder. ? Keep making circles with your fingers at the different pressures as you move down your breast. Stop when you feel your ribs. ? Move your fingers a little toward the center of your body. ? Start making circles with your fingers again, this time going up until you reach your collarbone. ? Keep making up-and-down circles until you reach your armpit. Remember to keep using the three pressures. ? Feel the other breast in the same way. 3. Sit or stand in the tub or shower. 4. With soapy water on your skin, feel each breast the same way you did in step 2 when you were lying on the floor. Write down what you find Writing down what you find can help you remember what to tell your doctor. Write down:  What is normal for each breast.  Any changes you find in each breast,  including: ? The kind of changes you find. ? Whether you have pain. ? Size and location of any lumps.  When you last had your menstrual period. General tips  Check your breasts every month.  If you are breastfeeding, the best time to check your breasts is after you feed your baby or after you use a breast pump.  If you get menstrual periods, the best time  to check your breasts is 5-7 days after your menstrual period is over.  With time, you will become comfortable with the self-exam, and you will begin to know if there are changes in your breasts. Contact a doctor if you:  See a change in the shape or size of your breasts or nipples.  See a change in the skin of your breast or nipples, such as red or scaly skin.  Have fluid coming from your nipples that is not normal.  Find a lump or thick area that was not there before.  Have pain in your breasts.  Have any concerns about your breast health. Summary  Breast self-awareness includes looking for changes in your breasts, as well as feeling for changes within your breasts.  Breast self-awareness should be done in front of a mirror in a well-lit room.  You should check your breasts every month. If you get menstrual periods, the best time to check your breasts is 5-7 days after your menstrual period is over.  Let your doctor know of any changes you see in your breasts, including changes in size, changes on the skin, pain or tenderness, or fluid from your nipples that is not normal. This information is not intended to replace advice given to you by your health care provider. Make sure you discuss any questions you have with your health care provider. Document Revised: 04/15/2018 Document Reviewed: 04/15/2018 Elsevier Patient Education  [place].

## 2020-07-18 ENCOUNTER — Ambulatory Visit: Payer: Federal, State, Local not specified - PPO | Attending: Internal Medicine

## 2020-07-18 DIAGNOSIS — Z23 Encounter for immunization: Secondary | ICD-10-CM

## 2020-07-18 NOTE — Progress Notes (Signed)
   Covid-19 Vaccination Clinic  Name:  Lindsay Ward    MRN: [id number] DOB: 20-Jun-1966  07/18/2020  Lindsay Ward was observed post Covid-19 immunization for 15 minutes without incident. She was provided with Vaccine Information Sheet and instruction to access the V-Safe system.   Lindsay Ward was instructed to call 911 with any severe reactions post vaccine: Marland Kitchen Difficulty breathing  . Swelling of face and throat  . A fast heartbeat  . A bad rash all over body  . Dizziness and weakness

## 2020-08-09 ENCOUNTER — Other Ambulatory Visit: Payer: Self-pay | Admitting: Obstetrics and Gynecology

## 2020-08-09 ENCOUNTER — Ambulatory Visit
Admission: RE | Admit: 2020-08-09 | Discharge: 2020-08-09 | Disposition: A | Discharge status: Home / Self Care | Payer: Federal, State, Local not specified - PPO | Source: Ambulatory Visit | Attending: Obstetrics and Gynecology | Admitting: Obstetrics and Gynecology

## 2020-08-09 ENCOUNTER — Other Ambulatory Visit: Payer: Self-pay

## 2020-08-09 DIAGNOSIS — R928 Other abnormal and inconclusive findings on diagnostic imaging of breast: Secondary | ICD-10-CM

## 2020-08-09 DIAGNOSIS — Z1231 Encounter for screening mammogram for malignant neoplasm of breast: Secondary | ICD-10-CM

## 2020-08-09 DIAGNOSIS — R921 Mammographic calcification found on diagnostic imaging of breast: Secondary | ICD-10-CM

## 2020-08-18 ENCOUNTER — Ambulatory Visit
Admission: RE | Admit: 2020-08-18 | Discharge: 2020-08-18 | Disposition: A | Discharge status: Home / Self Care | Payer: Federal, State, Local not specified - PPO | Source: Ambulatory Visit | Attending: Obstetrics and Gynecology | Admitting: Obstetrics and Gynecology

## 2020-08-18 ENCOUNTER — Other Ambulatory Visit: Payer: Self-pay

## 2020-08-18 DIAGNOSIS — R921 Mammographic calcification found on diagnostic imaging of breast: Secondary | ICD-10-CM | POA: Diagnosis present

## 2020-08-18 DIAGNOSIS — R928 Other abnormal and inconclusive findings on diagnostic imaging of breast: Secondary | ICD-10-CM | POA: Diagnosis present

## 2020-09-20 ENCOUNTER — Telehealth: Payer: Self-pay

## 2020-09-20 DIAGNOSIS — R928 Other abnormal and inconclusive findings on diagnostic imaging of breast: Secondary | ICD-10-CM

## 2020-09-20 NOTE — Telephone Encounter (Signed)
Pt called in and stated that she got her results from her Mammogram and that she wants to go over them and know what is the next step. Please advise

## 2020-09-20 NOTE — Addendum Note (Signed)
Addended by: Augusto Gamble on: 09/20/2020 11:38 AM   Modules accepted: Orders

## 2020-09-20 NOTE — Telephone Encounter (Signed)
Good morning Dr. Marcelline Mates, Not sure of which order to place for the pt. Please advise. Thanks [employer]

## 2020-09-20 NOTE — Telephone Encounter (Signed)
Thanks

## 2020-09-20 NOTE — Telephone Encounter (Signed)
Spoke to pt concerning her call to the office. Pt stated that she was aware of her mammogram results but needed an order placed to schedule her 6 month follow up ultrasound.

## 2020-09-20 NOTE — Telephone Encounter (Signed)
I have placed the order

## 2020-10-13 ENCOUNTER — Ambulatory Visit: Payer: Federal, State, Local not specified - PPO | Admitting: Dermatology

## 2020-10-13 ENCOUNTER — Other Ambulatory Visit: Payer: Self-pay

## 2020-10-13 DIAGNOSIS — D2361 Other benign neoplasm of skin of right upper limb, including shoulder: Secondary | ICD-10-CM | POA: Diagnosis not present

## 2020-10-13 DIAGNOSIS — D2362 Other benign neoplasm of skin of left upper limb, including shoulder: Secondary | ICD-10-CM

## 2020-10-13 DIAGNOSIS — L814 Other melanin hyperpigmentation: Secondary | ICD-10-CM

## 2020-10-13 DIAGNOSIS — D229 Melanocytic nevi, unspecified: Secondary | ICD-10-CM

## 2020-10-13 DIAGNOSIS — L821 Other seborrheic keratosis: Secondary | ICD-10-CM

## 2020-10-13 DIAGNOSIS — D239 Other benign neoplasm of skin, unspecified: Secondary | ICD-10-CM

## 2020-10-13 DIAGNOSIS — L818 Other specified disorders of pigmentation: Secondary | ICD-10-CM

## 2020-10-13 NOTE — Patient Instructions (Addendum)
Recommend taking Heliocare sun protection supplement daily in sunny weather for additional sun protection. For maximum protection on the sunniest days, you can take up to 2 capsules of regular Heliocare OR take 1 capsule of Heliocare Ultra. For prolonged exposure (such as a full day in the sun), you can repeat your dose of the supplement 4 hours after your first dose. Heliocare can be purchased at [facility] or at [email or web address].    The nature of sun-induced photo-aging and skin cancers is discussed.  Sun avoidance, protective clothing, and the use of 30-SPF sunscreens is advised. Observe closely for skin damage/changes, and call if such occurs.

## 2020-10-13 NOTE — Progress Notes (Signed)
   New Patient Visit  Subjective  Lindsay Ward is a 55 y.o. female who presents for the following: Nevus (Patient has a spot at right upper arm. Present for at least 2 years but has gotten larger. ).   The following portions of the chart were reviewed this encounter and updated as appropriate:   Tobacco  Allergies  Meds  Problems  Med Hx  Surg Hx  Fam Hx      Review of Systems:  No other skin or systemic complaints except as noted in HPI or Assessment and Plan.  Objective  Well appearing patient in no apparent distress; mood and affect are within normal limits.  A focused examination was performed including face, arms, legs, feet. Relevant physical exam findings are noted in the Assessment and Plan.  Objective  Right upper arm, left upper arm: Firm pink/brown papulenodule with dimple sign.   Objective  bilateral arms: Hypopigmented macules   Assessment & Plan  Dermatofibroma Right upper arm, left upper arm  Benign-appearing.  Observation.  Call clinic for new or changing lesions.  Recommend daily use of broad spectrum spf 30+ sunscreen to sun-exposed areas.    Idiopathic guttate hypomelanosis bilateral arms  Due to chronic sun exposure Benign appearing  Recommend daily broad spectrum sunscreen SPF 30+ to sun-exposed areas, reapply every 2 hours as needed. Call for new or changing lesions.  Recommend taking Heliocare sun protection supplement daily in sunny weather for additional sun protection. For maximum protection on the sunniest days, you can take up to 2 capsules of regular Heliocare OR take 1 capsule of Heliocare Ultra. For prolonged exposure (such as a full day in the sun), you can repeat your dose of the supplement 4 hours after your first dose.  Seborrheic Keratoses - Stuck-on, waxy, tan-brown papules and plaques  - Discussed benign etiology and prognosis. - Observe - Call for any changes  Lentigines - Scattered tan macules - Discussed due to sun  exposure - Benign, observe - Recommend daily broad spectrum sunscreen SPF 30+ to sun-exposed areas, reapply every 2 hours as needed. - Call for any changes  Melanocytic Nevi - Tan-brown and/or pink-flesh-colored symmetric macules and papules - Benign appearing on exam today - Observation - Call clinic for new or changing moles - Recommend daily use of broad spectrum spf 30+ sunscreen to sun-exposed areas.    Return if symptoms worsen or fail to improve.  Graciella Belton, RMA, am acting as scribe for Forest Gleason, MD .  Documentation: I have reviewed the above documentation for accuracy and completeness, and I agree with the above.  Forest Gleason, MD

## 2020-10-25 ENCOUNTER — Encounter: Payer: Self-pay | Admitting: Cardiovascular Disease

## 2020-10-25 ENCOUNTER — Other Ambulatory Visit: Payer: Self-pay

## 2020-10-25 ENCOUNTER — Ambulatory Visit: Payer: Federal, State, Local not specified - PPO | Admitting: Cardiovascular Disease

## 2020-10-25 VITALS — BP 112/82 | HR 55 | Ht 64.0 in | Wt 163.0 lb

## 2020-10-25 DIAGNOSIS — E782 Mixed hyperlipidemia: Secondary | ICD-10-CM | POA: Diagnosis not present

## 2020-10-25 DIAGNOSIS — D649 Anemia, unspecified: Secondary | ICD-10-CM

## 2020-10-25 DIAGNOSIS — R079 Chest pain, unspecified: Secondary | ICD-10-CM | POA: Diagnosis not present

## 2020-10-25 NOTE — Patient Instructions (Addendum)
Medication Instructions:  No changes   Lab work: No new labs needed  Testing/Procedures: We will order CT coronary calcium score ( family hx, hyperlipidemia) $99 out of pocket expense at our [facility] in [place]  This procedure uses special x-ray equipment to produce pictures of the coronary arteries to determine if they are blocked or narrowed by the buildup of plaque - an indicator for atherosclerosis or coronary artery disease (CAD).  Please call [phone] to schedule at your earliest convince   [address], [place] [id number]  (You will see you results in your MyChart the same time the doctor receives the results, they need time to review results and make recommendations, you can expect a call a few days after your test)   Follow-Up:  . You will need a follow up appointment as needed  . Providers on your designated Care Team:   . Murray Hodgkins, NP . Christell Faith, PA-C . Marrianne Mood, PA-C  Any Other Special Instructions Will Be Listed Below (If Applicable).

## 2020-10-25 NOTE — Progress Notes (Signed)
Cardiology Office Note  Date:  10/25/2020   ID:  Eldoris Beiser, DOB 1966-03-11, MRN [id number]  PCP:  Juluis Pitch, MD   Chief Complaint  Patient presents with  . Other    Chest pressure, sharp shoulder blade pain radiates to the front and tension. Meds reviewed verbally with pt.    HPI:  Samora Jernberg is a 55 year old woman with past medical history of Anemia hyperlipidemia Who presents by referral from Dr. Edman Circle for consultation of her chest pain Seen by Dr. Lovie Macadamia January 2022 Has mild anemia, History of hysterectomy 2018 secondary to fibroids  Pain left scapula area, atypical Feels it is likely musculoskeletal, stress Otherwise very active, walker, exercises almost daily Since summer, on good days , 40 min to 1 hour with walking, several miles With exertion no chest pain or shortness of breath  Concerned about family history and risk factors  Labs reviewed Total chol 243, LDL 162  EKG personally reviewed by myself on todays visit NSR rate 55 bpm no ST or T wave changes  Famiy history Father with lung cancer, smoker, may have had heart issues Uncle had some cardiac issues  PMH:   has a past medical history of Anemia and Fibroid uterus.  PSH:    Past Surgical History:  Procedure Laterality Date  . ABDOMINAL HYSTERECTOMY    . CESAREAN SECTION  2001  . CYSTOSCOPY  02/11/2017   Procedure: CYSTOSCOPY;  Surgeon: Rubie Maid, MD;  Location: [facility] ORS;  Service: Gynecology;;  . HYSTERECTOMY ABDOMINAL WITH SALPINGECTOMY N/A 02/11/2017   Procedure: HYSTERECTOMY ABDOMINAL WITH BILATERAL SALPINGECTOMY;  Surgeon: Rubie Maid, MD;  Location: [facility] OR[facility];  Service: Gynecology;  Laterality: N/A;  . TUBAL LIGATION    . UTERINE FIBROID EMBOLIZATION  2011    Current Outpatient Medications  Medication Sig Dispense Refill  . Cholecalciferol 50 MCG (2000 UT) CAPS Take by mouth every other day.    . ferrous sulfate 325 (65 FE) MG tablet Take by mouth.     No  current facility-administered medications for this visit.     Allergies:   Patient has no known allergies.   Social History:  The patient  reports that she has never smoked. She has never used smokeless tobacco. She reports that she does not drink alcohol and does not use drugs.   Family History:   family history includes Breast cancer in an other family member; Breast cancer (age of onset: 43) in her sister; Diabetes in her father and mother; Heart attack in her father; Hypertension in her mother; Lung cancer in her father; Stroke in her paternal grandfather.    Review of Systems: Review of Systems  Constitutional: Negative.   HENT: Negative.   Respiratory: Negative.   Cardiovascular: Negative.   Gastrointestinal: Negative.   Musculoskeletal: Negative.   Neurological: Negative.   Psychiatric/Behavioral: Negative.   All other systems reviewed and are negative.    PHYSICAL EXAM: VS:  BP 112/82 (BP Location: Right Arm, Patient Position: Sitting, Cuff Size: Normal)   Pulse (!) 55   Ht 5\' 4"  (1.626 m)   Wt 163 lb (73.9 kg)   LMP 02/08/2017   SpO2 99%   BMI 27.98 kg/m  , BMI Body mass index is 27.98 kg/m. GEN: Well nourished, well developed, in no acute distress HEENT: normal Neck: no JVD, carotid bruits, or masses Cardiac: RRR; no murmurs, rubs, or gallops,no edema  Respiratory:  clear to auscultation bilaterally, normal work of breathing GI: soft, nontender, nondistended, + BS MS:  no deformity or atrophy Skin: warm and dry, no rash Neuro:  Strength and sensation are intact Psych: euthymic mood, full affect  Recent Labs: No results found for requested labs within last 8760 hours.    Lipid Panel No results found for: CHOL, HDL, LDLCALC, TRIG    Wt Readings from Last 3 Encounters:  10/25/20 163 lb (73.9 kg)  05/26/20 157 lb 12.8 oz (71.6 kg)  05/21/19 162 lb 6.4 oz (73.7 kg)       ASSESSMENT AND PLAN:  Problem List Items Addressed This Visit   None   Visit  Diagnoses    Chest pain of uncertain etiology    -  Primary   Mixed hyperlipidemia       Anemia, unspecified type         Chest  Pain, Atypical in nature Suspect muscular, paravertebral muscle spasm Will order a CT coronary calcium score for family hx, hyperlipidemia  Hyperlipidemia Calcium score as above Up in past few years, now walking  Anemia Not on iron regular restarted    Total encounter time more than 60 minutes  Greater than 50% was spent in counseling and coordination of care with the patient    Signed, Esmond Plants, M.D., Ph.D. [place], [place]

## 2020-10-28 ENCOUNTER — Other Ambulatory Visit: Payer: Self-pay

## 2020-10-28 ENCOUNTER — Ambulatory Visit
Admission: RE | Admit: 2020-10-28 | Discharge: 2020-10-28 | Disposition: A | Discharge status: Home / Self Care | Payer: Federal, State, Local not specified - PPO | Source: Ambulatory Visit | Attending: Cardiovascular Disease | Admitting: Cardiovascular Disease

## 2020-10-28 DIAGNOSIS — E782 Mixed hyperlipidemia: Secondary | ICD-10-CM | POA: Insufficient documentation

## 2020-11-07 ENCOUNTER — Encounter: Payer: Self-pay | Admitting: Dermatology

## 2020-11-07 ENCOUNTER — Telehealth: Payer: Self-pay

## 2020-11-07 NOTE — Telephone Encounter (Signed)
Able to reach pt regarding her recent CT calcium score Dr. Rockey Situ had a chance to review her results and advised  " Score of zero  Mildly dilated ascending aorta, 4.1 cm, should be 3.5 cm at most  We suggest imaging in one year,  Consider echo in one year for f/u Lindsay Ward is delighted of results, verbalized understanding, otherwise all questions or concerns were address and no additional concerns at this time, will call back for anything further.

## 2021-02-17 ENCOUNTER — Ambulatory Visit
Admission: RE | Admit: 2021-02-17 | Discharge: 2021-02-17 | Disposition: A | Discharge status: Home / Self Care | Payer: Federal, State, Local not specified - PPO | Source: Ambulatory Visit | Attending: Obstetrics and Gynecology | Admitting: Obstetrics and Gynecology

## 2021-02-17 ENCOUNTER — Other Ambulatory Visit: Payer: Self-pay

## 2021-02-17 DIAGNOSIS — R928 Other abnormal and inconclusive findings on diagnostic imaging of breast: Secondary | ICD-10-CM

## 2021-03-16 ENCOUNTER — Telehealth: Payer: Self-pay | Admitting: Obstetrics and Gynecology

## 2021-03-16 NOTE — Telephone Encounter (Signed)
Lindsay Ward called in and stated she had a mammogram performed on June 10th.  She wanted to know if Dr. Marcelline Mates ever looked at it.  She also wanted to know if Dr. Marcelline Mates could put in the order for the mammogram to be performed every 6 months.  Please advise.

## 2021-03-17 ENCOUNTER — Other Ambulatory Visit: Payer: Self-pay | Admitting: Obstetrics and Gynecology

## 2021-03-17 DIAGNOSIS — R928 Other abnormal and inconclusive findings on diagnostic imaging of breast: Secondary | ICD-10-CM

## 2021-03-17 NOTE — Telephone Encounter (Signed)
Spoke to pt and informed her of the information left by [facility] on her mychart concerning her mammogram results. Pt stated that she did not know that the results were there. Pt requested a mammogram order to be placed.

## 2021-03-17 NOTE — Telephone Encounter (Signed)
Pt had mammogram done 02/17/2021 and requested for her follow up 6 month mammogram order to be placed. Please advise. Thanks [employer]

## 2021-03-21 NOTE — Telephone Encounter (Signed)
Informed pt that her mammogram order has been placed.

## 2021-05-11 ENCOUNTER — Telehealth: Payer: Self-pay | Admitting: Obstetrics and Gynecology

## 2021-05-11 NOTE — Telephone Encounter (Signed)
Please advise. Thanks Salle Brandle 

## 2021-05-11 NOTE — Telephone Encounter (Signed)
Patient called asking about breast US orders- when she called to schedule she was made aware that the order was not placed correctly. Please Advise.

## 2021-05-12 NOTE — Telephone Encounter (Signed)
Please inform patient that per her last mammogram results, they only recommended a repeat mammogram in 6 months. They did not mention anything about repeating an ultrasound.  If they desire for [place] to have that order placed as well they may want to let me know exactly what they need.

## 2021-05-16 ENCOUNTER — Other Ambulatory Visit: Payer: Self-pay

## 2021-05-16 ENCOUNTER — Other Ambulatory Visit: Payer: Self-pay | Admitting: Obstetrics and Gynecology

## 2021-05-16 ENCOUNTER — Telehealth: Payer: Self-pay

## 2021-05-16 DIAGNOSIS — R921 Mammographic calcification found on diagnostic imaging of breast: Secondary | ICD-10-CM

## 2021-05-16 DIAGNOSIS — Z1231 Encounter for screening mammogram for malignant neoplasm of breast: Secondary | ICD-10-CM

## 2021-05-16 DIAGNOSIS — R928 Other abnormal and inconclusive findings on diagnostic imaging of breast: Secondary | ICD-10-CM

## 2021-05-16 NOTE — Telephone Encounter (Signed)
Pt aware correct mammo order is in. Also placed order for u/s both breast just in case. Pt appreciative of call.

## 2021-05-17 NOTE — Telephone Encounter (Signed)
Pt called no answer LM via of information left by [facility].

## 2021-05-19 NOTE — Telephone Encounter (Signed)
Checked pt's chart she has a mammogram appt scheduled in Dec 2022.

## 2021-05-29 NOTE — Patient Instructions (Signed)
Preventive Care 40-57 Years Old, Female Preventive care refers to lifestyle choices and visits with your health care provider that can promote health and wellness. This includes: A yearly physical exam. This is also called an annual wellness visit. Regular dental and eye exams. Immunizations. Screening for certain conditions. Healthy lifestyle choices, such as: Eating a healthy diet. Getting regular exercise. Not using drugs or products that contain nicotine and tobacco. Limiting alcohol use. What can I expect for my preventive care visit? Physical exam Your health care provider will check your: Height and weight. These may be used to calculate your BMI (body mass index). BMI is a measurement that tells if you are at a healthy weight. Heart rate and blood pressure. Body temperature. Skin for abnormal spots. Counseling Your health care provider may ask you questions about your: Past medical problems. Family's medical history. Alcohol, tobacco, and drug use. Emotional well-being. Home life and relationship well-being. Sexual activity. Diet, exercise, and sleep habits. Work and work environment. Access to firearms. Method of birth control. Menstrual cycle. Pregnancy history. What immunizations do I need? Vaccines are usually given at various ages, according to a schedule. Your health care provider will recommend vaccines for you based on your age, medical history, and lifestyle or other factors, such as travel or where you work. What tests do I need? Blood tests Lipid and cholesterol levels. These may be checked every 5 years, or more often if you are over 50 years old. Hepatitis C test. Hepatitis B test. Screening Lung cancer screening. You may have this screening every year starting at age 55 if you have a 30-pack-year history of smoking and currently smoke or have quit within the past 15 years. Colorectal cancer screening. All adults should have this screening starting at  age 50 and continuing until age 75. Your health care provider may recommend screening at age 45 if you are at increased risk. You will have tests every 1-10 years, depending on your results and the type of screening test. Diabetes screening. This is done by checking your blood sugar (glucose) after you have not eaten for a while (fasting). You may have this done every 1-3 years. Mammogram. This may be done every 1-2 years. Talk with your health care provider about when you should start having regular mammograms. This may depend on whether you have a family history of breast cancer. BRCA-related cancer screening. This may be done if you have a family history of breast, ovarian, tubal, or peritoneal cancers. Pelvic exam and Pap test. This may be done every 3 years starting at age 21. Starting at age 30, this may be done every 5 years if you have a Pap test in combination with an HPV test. Other tests STD (sexually transmitted disease) testing, if you are at risk. Bone density scan. This is done to screen for osteoporosis. You may have this scan if you are at high risk for osteoporosis. Talk with your health care provider about your test results, treatment options, and if necessary, the need for more tests. Follow these instructions at home: Eating and drinking  Eat a diet that includes fresh fruits and vegetables, whole grains, lean protein, and low-fat dairy products. Take vitamin and mineral supplements as recommended by your health care provider. Do not drink alcohol if: Your health care provider tells you not to drink. You are pregnant, may be pregnant, or are planning to become pregnant. If you drink alcohol: Limit how much you have to 0-1 drink a day. Be   aware of how much alcohol is in your drink. In the [place] one drink equals one 12 oz bottle of beer (355 mL), one 5 oz glass of wine (148 mL), or one 1 oz glass of hard liquor (44 mL). Lifestyle Take daily care of your teeth and  gums. Brush your teeth every morning and night with fluoride toothpaste. Floss one time each day. Stay active. Exercise for at least 30 minutes 5 or more days each week. Do not use any products that contain nicotine or tobacco, such as cigarettes, e-cigarettes, and chewing tobacco. If you need help quitting, ask your health care provider. Do not use drugs. If you are sexually active, practice safe sex. Use a condom or other form of protection to prevent STIs (sexually transmitted infections). If you do not wish to become pregnant, use a form of birth control. If you plan to become pregnant, see your health care provider for a prepregnancy visit. If told by your health care provider, take low-dose aspirin daily starting at age 5. Find healthy ways to cope with stress, such as: Meditation, yoga, or listening to music. Journaling. Talking to a trusted person. Spending time with friends and family. Safety Always wear your seat belt while driving or riding in a vehicle. Do not drive: If you have been drinking alcohol. Do not ride with someone who has been drinking. When you are tired or distracted. While texting. Wear a helmet and other protective equipment during sports activities. If you have firearms in your house, make sure you follow all gun safety procedures. What's next? Visit your health care provider once a year for an annual wellness visit. Ask your health care provider how often you should have your eyes and teeth checked. Stay up to date on all vaccines. This information is not intended to replace advice given to you by your health care provider. Make sure you discuss any questions you have with your health care provider. Document Revised: 11/04/2020 Document Reviewed: 05/08/2018 Elsevier Patient Education  2022 [place] Breast self-awareness is knowing how your breasts look and feel. Doing breast self-awareness is important. It allows you to  catch a breast problem early while it is still small and can be treated. All women should do breast self-awareness, including women who have had breast implants. Tell your doctor if you notice a change in your breasts. What you need: A mirror. A well-lit room. How to do a breast self-exam A breast self-exam is one way to learn what is normal for your breasts and to check for changes. To do a breast self-exam: Look for changes  Take off all the clothes above your waist. Stand in front of a mirror in a room with good lighting. Put your hands on your hips. Push your hands down. Look at your breasts and nipples in the mirror to see if one breast or nipple looks different from the other. Check to see if: The shape of one breast is different. The size of one breast is different. There are wrinkles, dips, and bumps in one breast and not the other. Look at each breast for changes in the skin, such as: Redness. Scaly areas. Look for changes in your nipples, such as: Liquid around the nipples. Bleeding. Dimpling. Redness. A change in where the nipples are. Feel for changes  Lie on your back on the floor. Feel each breast. To do this, follow these steps: Pick a breast to feel. Put the arm closest to  that breast above your head. Use your other arm to feel the nipple area of your breast. Feel the area with the pads of your three middle fingers by making small circles with your fingers. For the first circle, press lightly. For the second circle, press harder. For the third circle, press even harder. Keep making circles with your fingers at the different pressures as you move down your breast. Stop when you feel your ribs. Move your fingers a little toward the center of your body. Start making circles with your fingers again, this time going up until you reach your collarbone. Keep making up-and-down circles until you reach your armpit. Remember to keep using the three pressures. Feel the other  breast in the same way. Sit or stand in the tub or shower. With soapy water on your skin, feel each breast the same way you did in step 2 when you were lying on the floor. Write down what you find Writing down what you find can help you remember what to tell your doctor. Write down: What is normal for each breast. Any changes you find in each breast, including: The kind of changes you find. Whether you have pain. Size and location of any lumps. When you last had your menstrual period. General tips Check your breasts every month. If you are breastfeeding, the best time to check your breasts is after you feed your baby or after you use a breast pump. If you get menstrual periods, the best time to check your breasts is 5-7 days after your menstrual period is over. With time, you will become comfortable with the self-exam, and you will begin to know if there are changes in your breasts. Contact a doctor if you: See a change in the shape or size of your breasts or nipples. See a change in the skin of your breast or nipples, such as red or scaly skin. Have fluid coming from your nipples that is not normal. Find a lump or thick area that was not there before. Have pain in your breasts. Have any concerns about your breast health. Summary Breast self-awareness includes looking for changes in your breasts, as well as feeling for changes within your breasts. Breast self-awareness should be done in front of a mirror in a well-lit room. You should check your breasts every month. If you get menstrual periods, the best time to check your breasts is 5-7 days after your menstrual period is over. Let your doctor know of any changes you see in your breasts, including changes in size, changes on the skin, pain or tenderness, or fluid from your nipples that is not normal. This information is not intended to replace advice given to you by your health care provider. Make sure you discuss any questions you have  with your health care provider. Document Revised: 04/15/2018 Document Reviewed: 04/15/2018 Elsevier Patient Education  [place].

## 2021-05-30 ENCOUNTER — Encounter: Payer: Self-pay | Admitting: Obstetrics and Gynecology

## 2021-05-30 ENCOUNTER — Ambulatory Visit (INDEPENDENT_AMBULATORY_CARE_PROVIDER_SITE_OTHER): Payer: Federal, State, Local not specified - PPO | Admitting: Obstetrics and Gynecology

## 2021-05-30 ENCOUNTER — Other Ambulatory Visit: Payer: Self-pay

## 2021-05-30 VITALS — BP 125/83 | HR 71 | Ht 64.0 in | Wt 162.3 lb

## 2021-05-30 DIAGNOSIS — Z01419 Encounter for gynecological examination (general) (routine) without abnormal findings: Secondary | ICD-10-CM

## 2021-05-30 DIAGNOSIS — N951 Menopausal and female climacteric states: Secondary | ICD-10-CM

## 2021-05-30 DIAGNOSIS — R928 Other abnormal and inconclusive findings on diagnostic imaging of breast: Secondary | ICD-10-CM

## 2021-05-30 DIAGNOSIS — Z23 Encounter for immunization: Secondary | ICD-10-CM | POA: Diagnosis not present

## 2021-05-30 DIAGNOSIS — Z1231 Encounter for screening mammogram for malignant neoplasm of breast: Secondary | ICD-10-CM

## 2021-05-30 NOTE — Progress Notes (Signed)
GYNECOLOGY ANNUAL PHYSICAL EXAM PROGRESS NOTE  Subjective:    Lindsay Ward is a 55 y.o. G47P1011 menopausal female who presents for an annual exam. The patient is sexually active. The patient has no major complaints today. The patient wears seatbelts: yes. The patient participates in regular exercise: yes. Has the patient ever been transfused or tattooed?: no.    Gynecologic History: Menarche age: 27 or 52 Patient's last menstrual period was 02/08/2017.  Is s/p hysterectomy.  Contraception: status post hysterectomy in 2018.  History of STI's: Denies Last Pap: 12/2014. Results were: normal.  No prior history of abnormal pap smears. Is s/p hysterectomy. Last mammogram: 02/17/2021. BI-RADS CATEGORY  3: Probably benign (stable calcifications in right breast). For repeat 6 months follow.  Last colonoscopy: 04/12/2014.  Results were: normal   OB History  Gravida Para Term Preterm AB Living  2 1 1  0 1 1  SAB IAB Ectopic Multiple Live Births  0 1 0 0 1    # Outcome Date GA Lbr Len/2nd Weight Sex Delivery Anes PTL Lv  2 Term 09/02/00    M CS-LTranv  N LIV  1 IAB             Past Medical History:  Diagnosis Date   Anemia    Fibroid uterus     Past Surgical History:  Procedure Laterality Date   ABDOMINAL HYSTERECTOMY     CESAREAN SECTION  2001   CYSTOSCOPY  02/11/2017   Procedure: CYSTOSCOPY;  Surgeon: Rubie Maid, MD;  Location: [facility] ORS;  Service: Gynecology;;   HYSTERECTOMY ABDOMINAL WITH SALPINGECTOMY N/A 02/11/2017   Procedure: HYSTERECTOMY ABDOMINAL WITH BILATERAL SALPINGECTOMY;  Surgeon: Rubie Maid, MD;  Location: [facility] ORS;  Service: Gynecology;  Laterality: N/A;   TUBAL LIGATION     UTERINE FIBROID EMBOLIZATION  2011    Family History  Problem Relation Age of Onset   Hypertension Mother    Diabetes Mother    Diabetes Father    Heart attack Father    Lung cancer Father    Breast cancer Other        great GM   Breast cancer Sister 39   Stroke Paternal Grandfather      Social History   Socioeconomic History   Marital status: Married    Spouse name: Not on file   Number of children: Not on file   Years of education: Not on file   Highest education level: Not on file  Occupational History   Not on file  Tobacco Use   Smoking status: Never   Smokeless tobacco: Never  Vaping Use   Vaping Use: Never used  Substance and Sexual Activity   Alcohol use: No   Drug use: No   Sexual activity: Yes    Birth control/protection: Surgical  Other Topics Concern   Not on file  Social History Narrative   Not on file   Social Determinants of Health   Financial Resource Strain: Not on file  Food Insecurity: Not on file  Transportation Needs: Not on file  Physical Activity: Not on file  Stress: Not on file  Social Connections: Not on file  Intimate Partner Violence: Not on file    Current Outpatient Medications on File Prior to Visit  Medication Sig Dispense Refill   Cholecalciferol 50 MCG (2000 UT) CAPS Take by mouth every other day.     ferrous sulfate 325 (65 FE) MG tablet Take by mouth.     No current facility-administered medications on  file prior to visit.    No Known Allergies    Review of Systems Constitutional: negative for chills, fatigue, fevers and sweats. Mild vasomotor symptoms.  Eyes: negative for irritation, redness and visual disturbance Ears, nose, mouth, throat, and face: negative for hearing loss, nasal congestion, snoring and tinnitus Respiratory: negative for asthma, cough, sputum Cardiovascular: negative for chest pain, dyspnea, exertional chest pressure/discomfort, irregular heart beat, palpitations and syncope Gastrointestinal: negative for abdominal pain, change in bowel habits, nausea and vomiting Genitourinary: negative for abnormal menstrual periods, genital lesions, sexual problems and vaginal discharge, dysuria and urinary incontinence Integument/breast: negative for breast lump, breast tenderness and nipple  discharge Hematologic/lymphatic: negative for bleeding and easy bruising Musculoskeletal:negative for back pain and muscle weakness.  Neurological: negative for dizziness, headaches, vertigo and weakness Endocrine: negative for diabetic symptoms including polydipsia, polyuria and skin dryness Allergic/Immunologic: negative for hay fever and urticaria      Objective:  Blood pressure 125/83, pulse 71, height 5\' 4"  (1.626 m), weight 162 lb 4.8 oz (73.6 kg), last menstrual period 02/08/2017. Body mass index is 27.86 kg/m.  General Appearance:    Alert, cooperative, no distress, appears stated age, overweight  Head:    Normocephalic, without obvious abnormality, atraumatic  Eyes:    PERRL, conjunctiva/corneas clear, EOM's intact, both eyes  Ears:    Normal external ear canals, both ears  Nose:   Nares normal, septum midline, mucosa normal, no drainage or sinus tenderness  Throat:   Lips, mucosa, and tongue normal; teeth and gums normal  Neck:   Supple, symmetrical, trachea midline, no adenopathy; thyroid: no enlargement/tenderness/nodules; no carotid bruit or JVD  Back:     Symmetric, no curvature, ROM normal, no CVA tenderness  Lungs:     Clear to auscultation bilaterally, respirations unlabored  Chest Wall:    No tenderness or deformity   Heart:    Regular rate and rhythm, S1 and S2 normal, no murmur, rub or gallop  Breast Exam:    No tenderness, masses, or nipple abnormality  Abdomen:     Soft, non-tender, bowel sounds active all four quadrants, no masses, no organomegaly.    Genitalia:    Pelvic:external genitalia normal, vagina without lesions, discharge, or tenderness, rectovaginal septum  normal. Cervix normal in appearance, no cervical motion tenderness, no adnexal masses or tenderness.  Uterus normal size, shape, mobile, regular contours, nontender.  Rectal:    Normal external sphincter.  No hemorrhoids appreciated. Internal exam not done.   Extremities:   Extremities normal,  atraumatic, no cyanosis or edema  Pulses:   2+ and symmetric all extremities  Skin:   Skin color, texture, turgor normal, no rashes or lesions  Lymph nodes:   Cervical, supraclavicular, and axillary nodes normal  Neurologic:   CNII-XII intact, normal strength, sensation and reflexes throughout   .  Labs:  Labs reviewed in Care Everywhere, performed 10/04/2020  Assessment:   Healthy female exam. Overweight  Menopausal vasomotor symptoms Need flu vaccine  H/o abnormal mammogram  Plan:    - Blood tests: none ordered. Has labs done by PCP.  - Breast self exam technique reviewed and patient encouraged to perform self-exam monthly. - Contraception: status post hysterectomy. - Colon screening: up to date . - Mammogram - up to date. For f/u in December.  - Pap smears no longer needed, is s/p hysterectomy with no prior h/o cervical dysplasia. - Discussed healthy lifestyle modifications. - Patient likely menopausal, mild symptoms, intermittent. Continues lifestyle modifications. Patient declines medications at this  time.   - Flu vaccine given today.  - COVID vaccination series completed.  - Follow up in 1 year.     Edwyna Shell, LPN [facility]

## 2021-08-21 ENCOUNTER — Ambulatory Visit
Admission: RE | Admit: 2021-08-21 | Discharge: 2021-08-21 | Disposition: A | Discharge status: Home / Self Care | Payer: Federal, State, Local not specified - PPO | Source: Ambulatory Visit | Attending: Obstetrics and Gynecology | Admitting: Obstetrics and Gynecology

## 2021-08-21 ENCOUNTER — Other Ambulatory Visit: Payer: Self-pay

## 2021-08-21 DIAGNOSIS — R928 Other abnormal and inconclusive findings on diagnostic imaging of breast: Secondary | ICD-10-CM | POA: Insufficient documentation

## 2021-08-21 DIAGNOSIS — Z1231 Encounter for screening mammogram for malignant neoplasm of breast: Secondary | ICD-10-CM | POA: Diagnosis present

## 2022-05-31 ENCOUNTER — Encounter: Payer: Federal, State, Local not specified - PPO | Admitting: Obstetrics and Gynecology

## 2022-07-12 ENCOUNTER — Telehealth: Payer: Self-pay | Admitting: Obstetrics and Gynecology

## 2022-07-12 DIAGNOSIS — Z1231 Encounter for screening mammogram for malignant neoplasm of breast: Secondary | ICD-10-CM

## 2022-07-12 NOTE — Telephone Encounter (Signed)
This pt called in to schedule her annual.   Her original appt was cancelled by [facility].  She is now scheduled with you on 12/5, but she would like to go ahead and have her mammogram done.  Can you please put in an order so that she can go ahead?  She states that she is behind on having her mammogram completed bc of the cancelled appt.

## 2022-07-12 NOTE — Telephone Encounter (Signed)
Order placed

## 2022-07-13 ENCOUNTER — Other Ambulatory Visit: Payer: Self-pay

## 2022-07-13 DIAGNOSIS — Z87898 Personal history of other specified conditions: Secondary | ICD-10-CM

## 2022-08-13 ENCOUNTER — Ambulatory Visit
Admission: RE | Admit: 2022-08-13 | Discharge: 2022-08-13 | Disposition: A | Discharge status: Home / Self Care | Payer: Federal, State, Local not specified - PPO | Source: Ambulatory Visit | Attending: Obstetrics and Gynecology | Admitting: Obstetrics and Gynecology

## 2022-08-13 DIAGNOSIS — Z87898 Personal history of other specified conditions: Secondary | ICD-10-CM | POA: Diagnosis present

## 2022-08-14 ENCOUNTER — Encounter: Payer: Self-pay | Admitting: Obstetrics and Gynecology

## 2022-08-14 ENCOUNTER — Ambulatory Visit (INDEPENDENT_AMBULATORY_CARE_PROVIDER_SITE_OTHER): Payer: Federal, State, Local not specified - PPO | Admitting: Obstetrics and Gynecology

## 2022-08-14 VITALS — BP 118/91 | HR 51 | Resp 16 | Ht 63.75 in | Wt 159.7 lb

## 2022-08-14 DIAGNOSIS — Z780 Asymptomatic menopausal state: Secondary | ICD-10-CM

## 2022-08-14 DIAGNOSIS — R1011 Right upper quadrant pain: Secondary | ICD-10-CM

## 2022-08-14 DIAGNOSIS — Z01419 Encounter for gynecological examination (general) (routine) without abnormal findings: Secondary | ICD-10-CM

## 2022-08-14 DIAGNOSIS — Z90710 Acquired absence of both cervix and uterus: Secondary | ICD-10-CM

## 2022-08-14 NOTE — Progress Notes (Signed)
ANNUAL PREVENTATIVE CARE GYNECOLOGY  ENCOUNTER NOTE  Subjective:       Lindsay Ward is a 56 y.o. G45P1011 female here for a routine annual gynecologic exam. The patient is sexually active. The patient is not taking hormone replacement therapy. Patient denies post-menopausal vaginal bleeding. The patient wears seatbelts: yes. The patient participates in regular exercise: yes. Has the patient ever been transfused or tattooed?: no. The patient reports that there is not domestic violence in her life.  Current complaints: 1.  She has noticed some gas getting trapped and causing some pain in her abdomen. It is gradually getting better. Occurs ~ 1-3 times per year.  Usually alleviated by walking  or moving around.  Usually lasts for several minutes.  More so happens in RUQ.     Gynecologic History Patient's last menstrual period was 01/21/2017. Contraception: status post hysterectomy Last Pap:  12/2014. Results were: normal No prior history of abnormal pap smears. Is s/p hysterectomy.  Last mammogram: 08/11/2021. Results were: 2: Benign. Screening mammogram in one year  Last Colonoscopy: 04/12/2014.  Results were: normal: Follow up in 10 years Last Dexa Scan: Never done   Obstetric History OB History  Gravida Para Term Preterm AB Living  '2 1 1   1 1  '$ SAB IAB Ectopic Multiple Live Births    1     1    # Outcome Date GA Lbr Len/2nd Weight Sex Delivery Anes PTL Lv  2 Term 09/02/00    M CS-LTranv  N LIV  1 IAB             Past Medical History:  Diagnosis Date   Anemia    Fibroid uterus     Family History  Problem Relation Age of Onset   Hypertension Mother    Diabetes Mother    Diabetes Father    Heart attack Father    Lung cancer Father    Breast cancer Other        great GM   Breast cancer Sister 60   Stroke Paternal Grandfather     Past Surgical History:  Procedure Laterality Date   ABDOMINAL HYSTERECTOMY     CESAREAN SECTION  2001   CYSTOSCOPY  02/11/2017   Procedure:  CYSTOSCOPY;  Surgeon: Rubie Maid, MD;  Location: [facility];  Service: Gynecology;;   HYSTERECTOMY ABDOMINAL WITH SALPINGECTOMY N/A 02/11/2017   Procedure: HYSTERECTOMY ABDOMINAL WITH BILATERAL SALPINGECTOMY;  Surgeon: Rubie Maid, MD;  Location: [facility];  Service: Gynecology;  Laterality: N/A;   TUBAL LIGATION     UTERINE FIBROID EMBOLIZATION  2011    Social History   Socioeconomic History   Marital status: Married    Spouse name: Not on file   Number of children: Not on file   Years of education: Not on file   Highest education level: Not on file  Occupational History   Not on file  Tobacco Use   Smoking status: Never   Smokeless tobacco: Never  Vaping Use   Vaping Use: Never used  Substance and Sexual Activity   Alcohol use: No   Drug use: No   Sexual activity: Yes    Birth control/protection: Surgical  Other Topics Concern   Not on file  Social History Narrative   Not on file   Social Determinants of Health   Financial Resource Strain: Not on file  Food Insecurity: Not on file  Transportation Needs: Not on file  Physical Activity: Not on file  Stress: Not on file  Social Connections: Not on file  Intimate Partner Violence: Not on file    Current Outpatient Medications on File Prior to Visit  Medication Sig Dispense Refill   Cholecalciferol 50 MCG (2000 UT) CAPS Take by mouth every other day.     ferrous sulfate 325 (65 FE) MG tablet Take by mouth.     No current facility-administered medications on file prior to visit.    No Known Allergies    Review of Systems ROS Review of Systems - General ROS: negative for - chills, fatigue, fever, hot flashes, night sweats, weight gain or weight loss Psychological ROS: negative for - anxiety, decreased libido, depression, mood swings, physical abuse or sexual abuse Ophthalmic ROS: negative for - blurry vision, eye pain or loss of vision ENT ROS: negative for - headaches, hearing change, visual changes or vocal  changes Allergy and Immunology ROS: negative for - hives, itchy/watery eyes or seasonal allergies Hematological and Lymphatic ROS: negative for - bleeding problems, bruising, swollen lymph nodes or weight loss Endocrine ROS: negative for - galactorrhea, hair pattern changes, hot flashes, malaise/lethargy, mood swings, palpitations, polydipsia/polyuria, skin changes, temperature intolerance or unexpected weight changes Breast ROS: negative for - new or changing breast lumps or nipple discharge Respiratory ROS: negative for - cough or shortness of breath Cardiovascular ROS: negative for - chest pain, irregular heartbeat, palpitations or shortness of breath Gastrointestinal ROS: positive for occasional RUQ abdominal pain, denies change in bowel habits, or black or bloody stools Genito-Urinary ROS: no dysuria, trouble voiding, or hematuria Musculoskeletal ROS: negative for - joint pain or joint stiffness Neurological ROS: negative for - bowel and bladder control changes Dermatological ROS: negative for rash and skin lesion changes   Objective:   BP (!) 118/91   Pulse (!) 51   Resp 16   Ht 5' 3.75" (1.619 m)   Wt 159 lb 11.2 oz (72.4 kg)   LMP 01/21/2017   BMI 27.63 kg/m  CONSTITUTIONAL: Well-developed, well-nourished female in no acute distress.  PSYCHIATRIC: Normal mood and affect. Normal behavior. Normal judgment and thought content. [place]: Alert and oriented to person, place, and time. Normal muscle tone coordination. No cranial nerve deficit noted. HENT:  Normocephalic, atraumatic, External right and left ear normal. Oropharynx is clear and moist EYES: Conjunctivae and EOM are normal. Pupils are equal, round, and reactive to light. No scleral icterus.  NECK: Normal range of motion, supple, no masses.  Normal thyroid.  SKIN: Skin is warm and dry. No rash noted. Not diaphoretic. No erythema. No pallor. CARDIOVASCULAR: Normal heart rate noted, regular rhythm, no murmur. RESPIRATORY:  Clear to auscultation bilaterally. Effort and breath sounds normal, no problems with respiration noted. BREASTS: Symmetric in size. No masses, skin changes, nipple drainage, or lymphadenopathy. ABDOMEN: Soft, normal bowel sounds, no distention noted.  No tenderness, rebound or guarding.  BLADDER: Normal PELVIC:  Bladder no bladder distension noted  Urethra: normal appearing urethra with no masses, tenderness or lesions  Vulva: normal appearing vulva with no masses, tenderness or lesions  Vagina: normal appearing vagina with normal color and discharge, no lesions  Cervix: surgically absent  Uterus: surgically absent, vaginal cuff well healed  Adnexa: normal adnexa in size, nontender and no masses  RV: External Exam NormaI, No Rectal Masses, and Normal Sphincter tone  MUSCULOSKELETAL: Normal range of motion. No tenderness.  No cyanosis, clubbing, or edema.  2+ distal pulses. LYMPHATIC: No Axillary, Supraclavicular, or Inguinal Adenopathy.   Labs: Reviewed in [place], 09/12/2021.    Assessment:  1. Encounter for well woman exam with routine gynecological exam   2. RUQ pain   3. History of hysterectomy   4. Menopause      Plan:  - Pap: Not needed. Patient is s/p hysterectomy.  - Mammogram:  Up to date.  - Colon Screening:   UTD.  - Labs:  Up to date . Due in January.  - Routine preventative health maintenance measures emphasized: Exercise/Diet/Weight control, Tobacco Warnings, Alcohol/Substance use risks, Stress Management, Peer Pressure Issues.  - COVID Vaccination status: UTD - Flu Vaccine: Done at [facility] - RUQ pain, discussed OTC remedies for flatulance, avoiding gassy foods.  If no resolution may need to consider other causes of pain, including gallbladder.  - Menopausal, also s/p hysterectomycurrently asymptomatic at this time.  - Return to [place], MD [place]

## 2022-08-14 NOTE — Patient Instructions (Signed)

## 2023-07-17 ENCOUNTER — Other Ambulatory Visit: Payer: Self-pay

## 2023-07-17 ENCOUNTER — Telehealth: Payer: Self-pay | Admitting: Obstetrics and Gynecology

## 2023-07-17 DIAGNOSIS — Z1231 Encounter for screening mammogram for malignant neoplasm of breast: Secondary | ICD-10-CM

## 2023-07-17 NOTE — Telephone Encounter (Signed)
Patient is requesting orders for Mammogram at [facility].  Please advise patient when this is complete

## 2023-07-29 ENCOUNTER — Other Ambulatory Visit: Payer: Self-pay | Admitting: Obstetrics and Gynecology

## 2023-07-29 DIAGNOSIS — Z1231 Encounter for screening mammogram for malignant neoplasm of breast: Secondary | ICD-10-CM

## 2023-08-20 ENCOUNTER — Ambulatory Visit
Admission: RE | Admit: 2023-08-20 | Discharge: 2023-08-20 | Disposition: A | Discharge status: Home / Self Care | Payer: Federal, State, Local not specified - PPO | Source: Ambulatory Visit | Attending: Obstetrics and Gynecology | Admitting: Obstetrics and Gynecology

## 2023-08-20 DIAGNOSIS — Z1231 Encounter for screening mammogram for malignant neoplasm of breast: Secondary | ICD-10-CM | POA: Insufficient documentation

## 2023-08-20 NOTE — Patient Instructions (Signed)
Preventive Care 40-59 Years Old, Female Preventive care refers to lifestyle choices and visits with your health care provider that can promote health and wellness. Preventive care visits are also called wellness exams. What can I expect for my preventive care visit? Counseling Your health care provider may ask you questions about your: Medical history, including: Past medical problems. Family medical history. Pregnancy history. Current health, including: Menstrual cycle. Method of birth control. Emotional well-being. Home life and relationship well-being. Sexual activity and sexual health. Lifestyle, including: Alcohol, nicotine or tobacco, and drug use. Access to firearms. Diet, exercise, and sleep habits. Work and work environment. Sunscreen use. Safety issues such as seatbelt and bike helmet use. Physical exam Your health care provider will check your: Height and weight. These may be used to calculate your BMI (body mass index). BMI is a measurement that tells if you are at a healthy weight. Waist circumference. This measures the distance around your waistline. This measurement also tells if you are at a healthy weight and may help predict your risk of certain diseases, such as type 2 diabetes and high blood pressure. Heart rate and blood pressure. Body temperature. Skin for abnormal spots. What immunizations do I need?  Vaccines are usually given at various ages, according to a schedule. Your health care provider will recommend vaccines for you based on your age, medical history, and lifestyle or other factors, such as travel or where you work. What tests do I need? Screening Your health care provider may recommend screening tests for certain conditions. This may include: Lipid and cholesterol levels. Diabetes screening. This is done by checking your blood sugar (glucose) after you have not eaten for a while (fasting). Pelvic exam and Pap test. Hepatitis B test. Hepatitis C  test. HIV (human immunodeficiency virus) test. STI (sexually transmitted infection) testing, if you are at risk. Lung cancer screening. Colorectal cancer screening. Mammogram. Talk with your health care provider about when you should start having regular mammograms. This may depend on whether you have a family history of breast cancer. BRCA-related cancer screening. This may be done if you have a family history of breast, ovarian, tubal, or peritoneal cancers. Bone density scan. This is done to screen for osteoporosis. Talk with your health care provider about your test results, treatment options, and if necessary, the need for more tests. Follow these instructions at home: Eating and drinking  Eat a diet that includes fresh fruits and vegetables, whole grains, lean protein, and low-fat dairy products. Take vitamin and mineral supplements as recommended by your health care provider. Do not drink alcohol if: Your health care provider tells you not to drink. You are pregnant, may be pregnant, or are planning to become pregnant. If you drink alcohol: Limit how much you have to 0-1 drink a day. Know how much alcohol is in your drink. In the [place] one drink equals one 12 oz bottle of beer (355 mL), one 5 oz glass of wine (148 mL), or one 1 oz glass of hard liquor (44 mL). Lifestyle Brush your teeth every morning and night with fluoride toothpaste. Floss one time each day. Exercise for at least 30 minutes 5 or more days each week. Do not use any products that contain nicotine or tobacco. These products include cigarettes, chewing tobacco, and vaping devices, such as e-cigarettes. If you need help quitting, ask your health care provider. Do not use drugs. If you are sexually active, practice safe sex. Use a condom or other form of protection to   prevent STIs. If you do not wish to become pregnant, use a form of birth control. If you plan to become pregnant, see your health care provider for a  prepregnancy visit. Take aspirin only as told by your health care provider. Make sure that you understand how much to take and what form to take. Work with your health care provider to find out whether it is safe and beneficial for you to take aspirin daily. Find healthy ways to manage stress, such as: Meditation, yoga, or listening to music. Journaling. Talking to a trusted person. Spending time with friends and family. Minimize exposure to UV radiation to reduce your risk of skin cancer. Safety Always wear your seat belt while driving or riding in a vehicle. Do not drive: If you have been drinking alcohol. Do not ride with someone who has been drinking. When you are tired or distracted. While texting. If you have been using any mind-altering substances or drugs. Wear a helmet and other protective equipment during sports activities. If you have firearms in your house, make sure you follow all gun safety procedures. Seek help if you have been physically or sexually abused. What's next? Visit your health care provider once a year for an annual wellness visit. Ask your health care provider how often you should have your eyes and teeth checked. Stay up to date on all vaccines. This information is not intended to replace advice given to you by your health care provider. Make sure you discuss any questions you have with your health care provider. Document Revised: 02/22/2021 Document Reviewed: 02/22/2021 Elsevier Patient Education  2024 Else[employer] Inc. Breast Self-Awareness Breast self-awareness is knowing how your breasts look and feel. You need to: Check your breasts on a regular basis. Tell your doctor about any changes. Become familiar with the look and feel of your breasts. This can help you catch a breast problem while it is still small and can be treated. You should do breast self-exams even if you have breast implants. What you need: A mirror. A well-lit room. A pillow or other  soft object. How to do a breast self-exam Follow these steps to do a breast self-exam: Look for changes  Take off all the clothes above your waist. Stand in front of a mirror in a room with good lighting. Put your hands down at your sides. Compare your breasts in the mirror. Look for any difference between them, such as: A difference in shape. A difference in size. Wrinkles, dips, and bumps in one breast and not the other. Look at each breast for changes in the skin, such as: Redness. Scaly areas. Skin that has gotten thicker. [place]. Open sores (ulcers). Look for changes in your nipples, such as: Fluid coming out of a nipple. Fluid around a nipple. Bleeding. [place]. Redness. A nipple that looks pushed in (retracted), or that has changed position. Feel for changes Lie on your back. Feel each breast. To do this: Pick a breast to feel. Place a pillow under the shoulder closest to that breast. Put the arm closest to that breast behind your head. Feel the nipple area of that breast using the hand of your other arm. Feel the area with the pads of your three middle fingers by making small circles with your fingers. Use light, medium, and firm pressure. Continue the overlapping circles, moving downward over the breast. Keep making circles with your fingers. Stop when you feel your ribs. Start making circles with your fingers again, this time going   upward until you reach your collarbone. Then, make circles outward across your breast and into your armpit area. Squeeze your nipple. Check for discharge and lumps. Repeat these steps to check your other breast. Sit or stand in the tub or shower. With soapy water on your skin, feel each breast the same way you did when you were lying down. Write down what you find Writing down what you find can help you remember what to tell your doctor. Write down: What is normal for each breast. Any changes you find in each breast. These  include: The kind of changes you find. A tender or painful breast. Any lump you find. Write down its size and where it is. When you last had your monthly period (menstrual cycle). General tips If you are breastfeeding, the best time to check your breasts is after you feed your baby or after you use a breast pump. If you get monthly bleeding, the best time to check your breasts is 5-7 days after your monthly cycle ends. With time, you will become comfortable with the self-exam. You will also start to know if there are changes in your breasts. Contact a doctor if: You see a change in the shape or size of your breasts or nipples. You see a change in the skin of your breast or nipples, such as red or scaly skin. You have fluid coming from your nipples that is not normal. You find a new lump or thick area. You have breast pain. You have any concerns about your breast health. Summary Breast self-awareness includes looking for changes in your breasts and feeling for changes within your breasts. You should do breast self-awareness in front of a mirror in a well-lit room. If you get monthly periods (menstrual cycles), the best time to check your breasts is 5-7 days after your period ends. Tell your doctor about any changes you see in your breasts. Changes include changes in size, changes on the skin, painful or tender breasts, or fluid from your nipples that is not normal. This information is not intended to replace advice given to you by your health care provider. Make sure you discuss any questions you have with your health care provider. Document Revised: 02/01/2022 Document Reviewed: 06/29/2021 Elsevier Patient Education  2024 [employer] Inc.  
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
## 2023-08-21 ENCOUNTER — Encounter: Payer: Self-pay | Admitting: Obstetrics and Gynecology

## 2023-08-21 ENCOUNTER — Ambulatory Visit (INDEPENDENT_AMBULATORY_CARE_PROVIDER_SITE_OTHER): Payer: Federal, State, Local not specified - PPO | Admitting: Obstetrics and Gynecology

## 2023-08-21 VITALS — BP 102/74 | HR 61 | Resp 16 | Ht 63.75 in | Wt 161.4 lb

## 2023-08-21 DIAGNOSIS — Z13220 Encounter for screening for lipoid disorders: Secondary | ICD-10-CM

## 2023-08-21 DIAGNOSIS — Z131 Encounter for screening for diabetes mellitus: Secondary | ICD-10-CM

## 2023-08-21 DIAGNOSIS — Z01419 Encounter for gynecological examination (general) (routine) without abnormal findings: Secondary | ICD-10-CM | POA: Diagnosis not present

## 2023-08-21 DIAGNOSIS — Z780 Asymptomatic menopausal state: Secondary | ICD-10-CM

## 2023-08-21 DIAGNOSIS — E785 Hyperlipidemia, unspecified: Secondary | ICD-10-CM

## 2023-08-21 DIAGNOSIS — E663 Overweight: Secondary | ICD-10-CM

## 2023-08-21 DIAGNOSIS — Z90710 Acquired absence of both cervix and uterus: Secondary | ICD-10-CM

## 2024-05-15 ENCOUNTER — Ambulatory Visit: Payer: Self-pay

## 2024-05-15 DIAGNOSIS — Z1211 Encounter for screening for malignant neoplasm of colon: Secondary | ICD-10-CM | POA: Diagnosis present

## 2024-05-15 DIAGNOSIS — K5730 Diverticulosis of large intestine without perforation or abscess without bleeding: Secondary | ICD-10-CM | POA: Diagnosis not present

## 2024-07-14 ENCOUNTER — Telehealth: Payer: Self-pay

## 2024-07-14 NOTE — Telephone Encounter (Signed)
 Pt calling for mamm to be ordered before apt I let her know as it would be a new provider she is seeying she would have to wait after apt. She stated she would ask her PCP. No other concern at this time.

## 2024-07-21 ENCOUNTER — Other Ambulatory Visit: Payer: Self-pay | Admitting: Family Medicine

## 2024-07-21 DIAGNOSIS — Z1231 Encounter for screening mammogram for malignant neoplasm of breast: Secondary | ICD-10-CM

## 2024-08-21 ENCOUNTER — Ambulatory Visit: Admitting: Licensed Practical Nurse

## 2024-08-26 ENCOUNTER — Inpatient Hospital Stay: Admission: RE | Admit: 2024-08-26 | Discharge: 2024-08-26 | Attending: Family Medicine | Admitting: Family Medicine

## 2024-08-26 DIAGNOSIS — Z1231 Encounter for screening mammogram for malignant neoplasm of breast: Secondary | ICD-10-CM | POA: Diagnosis present

## 2024-09-08 ENCOUNTER — Ambulatory Visit: Admitting: Obstetrics & Gynecology

## 2024-09-08 ENCOUNTER — Encounter: Payer: Self-pay | Admitting: Obstetrics & Gynecology

## 2024-09-08 VITALS — BP 108/70 | HR 67 | Ht 64.0 in | Wt 167.0 lb

## 2024-09-08 DIAGNOSIS — Z01419 Encounter for gynecological examination (general) (routine) without abnormal findings: Secondary | ICD-10-CM

## 2024-09-08 NOTE — Progress Notes (Signed)
 "  ANNUAL PREVENTATIVE CARE GYNECOLOGY  ENCOUNTER NOTE  Subjective:       Lindsay Ward is a 58 y.o. married G2P1011 (73 yo son) here for a routine annual gynecologic exam. The patient is sexually active. The patient is not taking hormone replacement therapy. Patient denies post-menopausal vaginal bleeding. The patient wears seatbelts: yes. The patient participates in regular exercise: yes. Has the patient ever been transfused or tattooed?: no. The patient reports that there is not domestic violence in her life.  Current complaints: 1.  No concerns, denies dyspareunia.    Gynecologic History Patient's last menstrual period was 01/21/2017.  Last Pap: n/a, s/p hysterectomy for fibroids, no h/o abnormal pap smears. Last mammogram: UTD. Results were: normal Last Colonoscopy: 2025  Married since 1965  FH-   Obstetric History OB History  Gravida Para Term Preterm AB Living  2 1 1  1 1   SAB IAB Ectopic Multiple Live Births   1   1    # Outcome Date GA Lbr Len/2nd Weight Sex Type Anes PTL Lv  2 Term 09/02/00    M CS-LTranv  N LIV  1 IAB             Past Medical History:  Diagnosis Date   Anemia    Fibroid uterus     Family History  Problem Relation Age of Onset   Hypertension Mother    Diabetes Mother    Diabetes Father    Heart attack Father    Lung cancer Father    Breast cancer Other        great GM   Breast cancer Sister 72   Stroke Paternal Grandfather     Past Surgical History:  Procedure Laterality Date   ABDOMINAL HYSTERECTOMY     CESAREAN SECTION  2001   CYSTOSCOPY  02/11/2017   Procedure: CYSTOSCOPY;  Surgeon: Connell Davies, MD;  Location: [facility];  Service: Gynecology;;   HYSTERECTOMY ABDOMINAL WITH SALPINGECTOMY N/A 02/11/2017   Procedure: HYSTERECTOMY ABDOMINAL WITH BILATERAL SALPINGECTOMY;  Surgeon: Connell Davies, MD;  Location: [facility];  Service: Gynecology;  Laterality: N/A;   TUBAL LIGATION     UTERINE FIBROID EMBOLIZATION  2011    Social History    Socioeconomic History   Marital status: Married    Spouse name: Not on file   Number of children: Not on file   Years of education: Not on file   Highest education level: Not on file  Occupational History   Not on file  Tobacco Use   Smoking status: Never   Smokeless tobacco: Never  Vaping Use   Vaping status: Never Used  Substance and Sexual Activity   Alcohol use: No   Drug use: No   Sexual activity: Yes    Birth control/protection: Surgical  Other Topics Concern   Not on file  Social History Narrative   Not on file   Social Drivers of Health   Tobacco Use: Low Risk (09/08/2024)   Patient History    Smoking Tobacco Use: Never    Smokeless Tobacco Use: Never    Passive Exposure: Not on file  Financial Resource Strain: Low Risk  (10/03/2022)   Received from [facility]   Overall Financial Resource Strain (CARDIA)    Difficulty of Paying Living Expenses: Not hard at all  Food Insecurity: No Food Insecurity (10/03/2022)   Received from [facility]   Epic    Within the past 12 months, you worried that your  food would run out before you got the money to buy more.: Never true    Within the past 12 months, the food you bought just didn't last and you didn't have money to get more.: Never true  Transportation Needs: No Transportation Needs (10/03/2022)   Received from [facility] - Transportation    In the past 12 months, has lack of transportation kept you from medical appointments or from getting medications?: No    Lack of Transportation (Non-Medical): No  Physical Activity: Not on file  Stress: Not on file  Social Connections: Not on file  Intimate Partner Violence: Not on file  Depression (PHQ2-9): Low Risk (08/21/2023)   Depression (PHQ2-9)    PHQ-2 Score: 0  Alcohol Screen: Not on file  Housing: Unknown (09/27/2023)   Received from [facility]   Epic    In the last 12 months, was  there a time when you were not able to pay the mortgage or rent on time?: No    Number of Times Moved in the Last Year: Not on file    At any time in the past 12 months, were you homeless or living in a shelter (including now)?: No  Utilities: Not At Risk (10/03/2022)   Received from [facility] Utilities    Threatened with loss of utilities: No  Health Literacy: Not on file    Medications Ordered Prior to Encounter[1]  Allergies[2]    Review of Systems ROS Review of Systems - General ROS: negative for - chills, fatigue, fever, hot flashes, night sweats, weight gain or weight loss Psychological ROS: negative for - anxiety, decreased libido, depression, mood swings, physical abuse or sexual abuse Ophthalmic ROS: negative for - blurry vision, eye pain or loss of vision ENT ROS: negative for - headaches, hearing change, visual changes or vocal changes Allergy and Immunology ROS: negative for - hives, itchy/watery eyes or seasonal allergies Hematological and Lymphatic ROS: negative for - bleeding problems, bruising, swollen lymph nodes or weight loss Endocrine ROS: negative for - galactorrhea, hair pattern changes, hot flashes, malaise/lethargy, mood swings, palpitations, polydipsia/polyuria, skin changes, temperature intolerance or unexpected weight changes Breast ROS: negative for - new or changing breast lumps or nipple discharge Respiratory ROS: negative for - cough or shortness of breath Cardiovascular ROS: negative for - chest pain, irregular heartbeat, palpitations or shortness of breath Gastrointestinal ROS: no abdominal pain, change in bowel habits, or black or bloody stools Genito-Urinary ROS: no dysuria, trouble voiding, or hematuria Musculoskeletal ROS: negative for - joint pain or joint stiffness Neurological ROS: negative for - bowel and bladder control changes Dermatological ROS: negative for rash and skin lesion changes   Objective:   BP 108/70    Pulse 67   Ht 5' 4 (1.626 m)   Wt 167 lb (75.8 kg)   LMP 01/21/2017   BMI 28.67 kg/m  CONSTITUTIONAL: Well-developed, well-nourished female in no acute distress.  PSYCHIATRIC: Normal mood and affect. Normal behavior. Normal judgment and thought content. NEUROLGIC: Alert and oriented to person, place, and time. Normal muscle tone coordination. No cranial nerve deficit noted. HENT:  Normocephalic, atraumatic, External right and left ear normal. Oropharynx is clear and moist EYES: Conjunctivae and EOM are normal. Pupils are equal, round, and reactive to light. No scleral icterus.  NECK: Normal range of motion, supple, no masses.  Normal thyroid.  SKIN: Skin is warm and dry. No rash noted. Not diaphoretic. No erythema.  No pallor. CARDIOVASCULAR: Normal heart rate noted, regular rhythm, no murmur. RESPIRATORY: Clear to auscultation bilaterally. Effort and breath sounds normal, no problems with respiration noted. BREASTS: Symmetric in size. No masses, skin changes, nipple drainage, or lymphadenopathy. ABDOMEN: Soft, normal bowel sounds, no distention noted.  No tenderness, rebound or guarding.  EG: normal Speculum exam: normal Bimanual: no masses or tenderness  MUSCULOSKELETAL: Normal range of motion. No tenderness.  No cyanosis, clubbing, or edema.  2+ distal pulses. LYMPHATIC: No Axillary, Supraclavicular, or Inguinal Adenopathy.    Assessment:   1. Encounter for well woman exam with routine gynecological exam      Plan:   Routine preventative health maintenance measures emphasized:   Return to Clinic - 1 Year   Harland JAYSON Birkenhead, MD [facility]                 [1]  No current outpatient medications on file prior to visit.   No current facility-administered medications on file prior to visit.  [2] No Known Allergies  "

## 2024-10-01 ENCOUNTER — Ambulatory Visit

## 2024-10-01 DIAGNOSIS — D1801 Hemangioma of skin and subcutaneous tissue: Secondary | ICD-10-CM | POA: Diagnosis not present

## 2024-10-01 DIAGNOSIS — W908XXA Exposure to other nonionizing radiation, initial encounter: Secondary | ICD-10-CM | POA: Diagnosis not present

## 2024-10-01 DIAGNOSIS — L578 Other skin changes due to chronic exposure to nonionizing radiation: Secondary | ICD-10-CM | POA: Diagnosis not present

## 2024-10-01 DIAGNOSIS — D239 Other benign neoplasm of skin, unspecified: Secondary | ICD-10-CM

## 2024-10-01 DIAGNOSIS — Z1283 Encounter for screening for malignant neoplasm of skin: Secondary | ICD-10-CM

## 2024-10-01 DIAGNOSIS — L814 Other melanin hyperpigmentation: Secondary | ICD-10-CM | POA: Diagnosis not present

## 2024-10-01 DIAGNOSIS — D229 Melanocytic nevi, unspecified: Secondary | ICD-10-CM

## 2024-10-01 DIAGNOSIS — L821 Other seborrheic keratosis: Secondary | ICD-10-CM

## 2024-10-01 NOTE — Patient Instructions (Addendum)
 Sunscreen  Who needs sunscreen? Everyone. Sunscreen use can help prevent skin cancer by protecting you from the sun's harmful ultraviolet rays. Anyone can get skin cancer, regardless of age, gender or race. In fact, it is estimated that one in five Americans will develop skin cancer in their lifetime.  Sunscreen alone cannot fully protect you. In addition to wearing sunscreen, dermatologists recommend taking the following steps to protect your skin and find skin cancer early:  Seek shade when appropriate, remembering that the sun's rays are strongest between 10 a.m. and 2 p.m. If your shadow is shorter than you are, seek shade. Dress to protect yourself from the sun by wearing a lightweight long-sleeved shirt, pants, a wide-brimmed hat and sunglasses, when possible.  Use extra caution near water , snow and sand as they reflect the damaging rays of the sun, which can increase your chance of sunburn.  Get vitamin D  safely through a healthy diet that may include vitamin supplements. Don't seek the sun. Avoid tanning beds. Ultraviolet light from the sun and tanning beds can cause skin cancer and wrinkling. If you want to look tan, you may wish to use a self-tanning product, but continue to use sunscreen with it.  When should I use sunscreen? Every day you go outside--even if you're just walking to and from your form of transportation. The sun emits harmful UV rays year-round. Even on cloudy days, up to 80 percent of the sun's harmful UV rays can penetrate your skin. Snow, sand and water  increase the need for sunscreen because they reflect the sun's rays.  How much sunscreen should I use, and how often should I apply it? Most people only apply 25-50 percent of the recommended amount of sunscreen. Apply enough sunscreen to cover all exposed skin. Most adults need about 1 ounce -- or enough to fill a shot glass -- to fully cover their body.  Don't forget to apply to the tops of your feet, your neck, your ears  and the top of your head. Apply sunscreen to dry skin 15 minutes before going outdoors.  Skin cancer also can form on the lips. To protect your lips, apply a lip balm or lipstick that contains sunscreen with an SPF of 30 or higher.  When outdoors, reapply sunscreen approximately every two hours, or after swimming or sweating, according to the directions on the bottle.   Broad-spectrum sunscreens protect against both UVA and UVB rays. What is the difference between the rays? Sunlight consists of two types of harmful rays that reach the earth -- UVA rays and UVB rays. Overexposure to either can lead to skin cancer. In addition to causing skin cancer, here's what each of these rays do:  UVA rays (or aging rays) can prematurely age your skin, causing wrinkles and age spots, and can pass through window glass. UVB rays (or burning rays) are the primary cause of sunburn and are blocked by window glass  There is no safe way to tan. Every time you tan, you damage your skin. As this damage builds, you speed up the aging of your skin and increase your risk for all types of skin cancer.  What is the difference between chemical and physical sunscreens? Chemical sunscreens work like a sponge, absorbing the sun's rays. They contain one or more of the following active ingredients: oxybenzone, avobenzone, octisalate, octocrylene, homosalate and octinoxate. These formulations tend to be easier to rub into the skin without leaving a white residue.   Physical sunscreens work like a shield,  sitting sit on the surface of your skin and deflecting the sun's rays. They contain the active ingredients zinc oxide and/or titanium dioxide. Use this sunscreen if you have sensitive skin.   What type of sunscreen should I use? The best type of sunscreen is the one you will use again and again. Just make sure it offers broad-spectrum (UVA and UVB) protection, has an SPF of 30+, and is water -resistant. The kind of sunscreen you use is  a matter of personal choice, and may vary depending on the area of the body to be protected. Available sunscreen options include lotions, creams, gels, ointments, wax sticks and sprays.  Recommended physical sunscreens for face: - Neutrogena Sheer Zinc - Aveeno Positively Mineral Sensitive - CeraVe Hydrating Mineral (also has a tinted version) - La Roche-Posay Anthelios Mineral Face (comes as a cream, lotion, light fluid, and there is also a tinted version).  - EltaMD UV Clear (also has a tinted version)  Recommended physical sunscreens for body: - Neutrogena Sheer Zinc Dry-Touch Sunscreen Sensitive Skin Lotion Broad Spectrum SPF 50 - Aveeno Positively Mineral Sensitive Skin Sunscreen Broad Spectrum SPF 50 - La Roche-Posay Anthelios SPF 50 Mineral Sunscreen - Gentle Lotion - CeraVe Hydrating Mineral Sunscreen SPF 50  Recommended chemical sunscreens for face: - Anthelios UV Correct Face Sunscreen SPF 70 with Niacinamide - Neutrogena Clear Face Oil-Free SPF 50 with Helioplex - Neutrogena Sport Face Oil-Free SPF 70+ with Helioplex - Aveeno Protect + Hydrate Sunscreen For Face SPF 70 - La Roche-Posay Anthelios Light Fluid Sunscreen for Face SPF 60  Recommended chemical sunscreens for body: - Neutrogena Ultra Sheer Dry-Touch Sunscreen SPF 70 - Aveeno Protect + Hydrate Broad Spectrum All-Day Hydration SPF 60 (comes in a big pump) - La Roche-Posay Anthelios Melt-In Milk Sunscreen SPF 60    Due to recent changes in healthcare laws, you may see results of your pathology and/or laboratory studies on MyChart before the doctors have had a chance to review them. We understand that in some cases there may be results that are confusing or concerning to you. Please understand that not all results are received at the same time and often the doctors may need to interpret multiple results in order to provide you with the best plan of care or course of treatment. Therefore, we ask that you please give us  2  business days to thoroughly review all your results before contacting the office for clarification. Should we see a critical lab result, you will be contacted sooner.   If You Need Anything After Your Visit  If you have any questions or concerns for your doctor, please call our main line at [phone] and press option 4 to reach your doctor's medical assistant. If no one answers, please leave a voicemail as directed and we will return your call as soon as possible. Messages left after 4 pm will be answered the following business day.   You may also send us  a message via MyChart. We typically respond to MyChart messages within 1-2 business days.  For prescription refills, please ask your pharmacy to contact our office. Our fax number is [phone].  If you have an urgent issue when the clinic is closed that cannot wait until the next business day, you can page your doctor at the number below.    Please note that while we do our best to be available for urgent issues outside of office hours, we are not available 24/7.   If you have an urgent issue and are  unable to reach us , you may choose to seek medical care at your doctor's office, retail clinic, urgent care center, or emergency room.  If you have a medical emergency, please immediately call 911 or go to the emergency department.  Pager Numbers  - Dr. Hester: [phone]  - Dr. Jackquline: [phone]  - Dr. Claudene: [phone]   In the event of inclement weather, please call our main line at [phone] for an update on the status of any delays or closures.  Dermatology Medication Tips: Please keep the boxes that topical medications come in in order to help keep track of the instructions about where and how to use these. Pharmacies typically print the medication instructions only on the boxes and not directly on the medication tubes.   If your medication is too expensive, please contact our office at [phone] option 4 or  send us  a message through MyChart.   We are unable to tell what your co-pay for medications will be in advance as this is different depending on your insurance coverage. However, we may be able to find a substitute medication at lower cost or fill out paperwork to get insurance to cover a needed medication.   If a prior authorization is required to get your medication covered by your insurance company, please allow us  1-2 business days to complete this process.  Drug prices often vary depending on where the prescription is filled and some pharmacies may offer cheaper prices.  The website www.goodrx.com contains coupons for medications through different pharmacies. The prices here do not account for what the cost may be with help from insurance (it may be cheaper with your insurance), but the website can give you the price if you did not use any insurance.  - You can print the associated coupon and take it with your prescription to the pharmacy.  - You may also stop by our office during regular business hours and pick up a GoodRx coupon card.  - If you need your prescription sent electronically to a different pharmacy, notify our office through [facility] or by phone at [phone] option 4.     Si Usted Necesita Algo Despus de Su Visita  Tambin puede enviarnos un mensaje a travs de Clinical cytogeneticist. Por lo general respondemos a los mensajes de MyChart en el transcurso de 1 a 2 das hbiles.  Para renovar recetas, por favor pida a su farmacia que se ponga en contacto con nuestra oficina. Randi lakes de fax es [place] [phone].  Si tiene un asunto urgente cuando la clnica est cerrada y que no puede esperar hasta el siguiente da hbil, puede llamar/localizar a su doctor(a) al nmero que aparece a continuacin.   Por favor, tenga en cuenta que aunque hacemos todo lo posible para estar disponibles para asuntos urgentes fuera del horario de [place], no estamos disponibles las 24 horas del  da, los 7 [address]  [address] de la [place].   Si tiene un problema urgente y no puede comunicarse con nosotros, puede optar por buscar atencin mdica  en el consultorio de su doctor(a), en una clnica privada, en un centro de atencin urgente o en una sala de emergencias.  Si tiene Engineer, drilling, por favor llame inmediatamente al 911 o vaya a la sala de emergencias.  Nmeros de bper  - Dr. Hester: [phone]  - Dra. Jackquline: [phone]  - Dr. Claudene: [phone]   En caso de inclemencias del tiempo, por favor llame a landry capes principal al [phone] para ignacia actualizacin sobre  el estado de cualquier retraso o cierre.  Consejos para la medicacin en dermatologa: Por favor, guarde las cajas en las que vienen los medicamentos de uso tpico para ayudarle a seguir las instrucciones sobre dnde y cmo usarlos. Las farmacias generalmente imprimen las instrucciones del medicamento slo en las cajas y no directamente en los tubos del [place].   Si su medicamento es muy caro, por favor, pngase en contacto con landry rieger llamando al [phone] y presione la opcin 4 o envenos un mensaje a travs de Clinical cytogeneticist.   No podemos decirle cul ser su copago por los medicamentos por adelantado ya que esto es diferente dependiendo de la cobertura de su seguro. Sin embargo, es posible que podamos encontrar un medicamento sustituto a Audiological scientist un formulario para que el seguro cubra el medicamento que se considera necesario.   Si se requiere una autorizacin previa para que su compaa de seguros malta su medicamento, por favor permtanos de 1 a 2 das hbiles para completar este proceso.  Los precios de los medicamentos varan con frecuencia dependiendo del Environmental consultant de dnde se surte la receta y alguna farmacias pueden ofrecer precios ms baratos.  El sitio web www.goodrx.com tiene cupones para medicamentos de Health and safety inspector. Los precios aqu no tienen en cuenta lo que podra  costar con la ayuda del seguro (puede ser ms barato con su seguro), pero el sitio web puede darle el precio si no utiliz Tourist information centre manager.  - Puede imprimir el cupn correspondiente y llevarlo con su receta a la farmacia.  - Tambin puede pasar por nuestra oficina durante el horario de atencin regular y Education officer, museum una tarjeta de cupones de GoodRx.  - Si necesita que su receta se enve electrnicamente a una farmacia diferente, informe a nuestra oficina a travs de MyChart de [place] o por telfono llamando al [phone] y presione la opcin 4.

## 2024-10-01 NOTE — Progress Notes (Signed)
" °  °  Subjective   Lindsay Ward is a 59 y.o. female who presents for the following: Total body skin exam for skin cancer screening and mole check. The patient has spots, moles and lesions to be evaluated, some may be new or changing and the patient may have concern these could be cancer. Patient is new patient.  Today patient reports: Patient denies any LOC's was referred by PCP to have annual skin checks. No personal or family hx of skin cancer.   Review of Systems:    No other skin or systemic complaints except as noted in HPI or Assessment and Plan.  The following portions of the chart were reviewed this encounter and updated as appropriate: medications, allergies, medical history  Relevant Medical History:  n/a   Objective  (SKPE) Well appearing patient in no apparent distress; mood and affect are within normal limits. Examination was performed of the: Full Skin Examination: scalp, head, eyes, ears, nose, lips, neck, chest, axillae, abdomen, back, buttocks, bilateral upper extremities, bilateral lower extremities, hands, feet, fingers, toes, fingernails, and toenails.   Examination notable for: SKIN EXAM, Lentigo/lentigines: Scattered pigmented macules that are tan to brown in color and are somewhat non-uniform in shape and concentrated in the sun-exposed areas, Nevus/nevi: Scattered well-demarcated, regular, pigmented macule(s) and/or papule(s)  , Seborrheic Keratosis(es): Stuck-on appearing keratotic papule(s) on the trunk, none  irritated with redness, crusting, edema, and/or partial avulsion, Dermatofibroma(s): Firm dermal nodule(s) with a hyperpigmented halo and a positive dimple sign   Examination limited by: N/a     Assessment & Plan  (SKAP)   SKIN CANCER SCREENING PERFORMED TODAY.  BENIGN SKIN FINDINGS  - Lentigines  - Seborrheic keratoses  - Nevus/Multiple Benign Nevi   - Dermatofibroma - Reassurance provided regarding the benign appearance of lesions noted on exam today;  no treatment is indicated in the absence of symptoms/changes. - Reinforced importance of photoprotective strategies including liberal and frequent sunscreen use of a broad-spectrum SPF 30 or greater, use of protective clothing, and sun avoidance for prevention of cutaneous malignancy and photoaging.  Counseled patient on the importance of regular self-skin monitoring as well as routine clinical skin examinations as scheduled.   ACTINIC DAMAGE - Chronic condition, secondary to cumulative UV/sun exposure - Recommend daily broad spectrum sunscreen SPF 30+ to sun-exposed areas, reapply every 2 hours as needed.  - Staying in the shade or wearing long sleeves, sun glasses (UVA+UVB protection) and wide brim hats (4-inch brim around the entire circumference of the hat) are also recommended for sun protection.  - Call for new or changing lesions.  Was sun protection counseling provided?: Yes   Level of service outlined above   Patient instructions (SKPI)   Procedures, orders, diagnosis for this visit:    There are no diagnoses linked to this encounter.  Return to clinic: Return for 1-2 years TBSE , w/ Dr. Raymund.  Lindsay Ward, CMA, am acting as scribe for Lauraine JAYSON Raymund, MD.  Documentation: I have reviewed the above documentation for accuracy and completeness, and I agree with the above.  Lauraine JAYSON Raymund, MD  "

## 2026-09-29 ENCOUNTER — Encounter
